# Patient Record
Sex: Female | Born: 1955 | Race: White | Hispanic: No | Marital: Single | Smoking: Former smoker
Health system: Southern US, Community
[De-identification: ages and names within clinical notes are randomized; demographics above are authoritative.]

## PROBLEM LIST (undated history)

## (undated) DIAGNOSIS — G4730 Sleep apnea, unspecified: Secondary | ICD-10-CM

## (undated) DIAGNOSIS — E785 Hyperlipidemia, unspecified: Secondary | ICD-10-CM

## (undated) DIAGNOSIS — M545 Low back pain, unspecified: Secondary | ICD-10-CM

## (undated) DIAGNOSIS — I10 Essential (primary) hypertension: Secondary | ICD-10-CM

## (undated) DIAGNOSIS — D649 Anemia, unspecified: Secondary | ICD-10-CM

## (undated) DIAGNOSIS — F419 Anxiety disorder, unspecified: Secondary | ICD-10-CM

## (undated) DIAGNOSIS — E119 Type 2 diabetes mellitus without complications: Secondary | ICD-10-CM

## (undated) DIAGNOSIS — Z923 Personal history of irradiation: Secondary | ICD-10-CM

## (undated) DIAGNOSIS — C50412 Malignant neoplasm of upper-outer quadrant of left female breast: Secondary | ICD-10-CM

## (undated) DIAGNOSIS — K219 Gastro-esophageal reflux disease without esophagitis: Secondary | ICD-10-CM

## (undated) DIAGNOSIS — C801 Malignant (primary) neoplasm, unspecified: Secondary | ICD-10-CM

## (undated) HISTORY — DX: Anxiety disorder, unspecified: F41.9

## (undated) HISTORY — DX: Gastro-esophageal reflux disease without esophagitis: K21.9

## (undated) HISTORY — DX: Type 2 diabetes mellitus without complications: E11.9

## (undated) HISTORY — PX: TONSILLECTOMY: SUR1361

## (undated) HISTORY — PX: TUBAL LIGATION: SHX77

## (undated) HISTORY — PX: DILATION AND CURETTAGE OF UTERUS: SHX78

## (undated) HISTORY — DX: Essential (primary) hypertension: I10

## (undated) HISTORY — DX: Hyperlipidemia, unspecified: E78.5

## (undated) HISTORY — DX: Anemia, unspecified: D64.9

## (undated) HISTORY — PX: COLONOSCOPY: SHX174

## (undated) HISTORY — DX: Malignant neoplasm of upper-outer quadrant of left female breast: C50.412

## (undated) HISTORY — PX: ESOPHAGOGASTRODUODENOSCOPY: SHX1529

---

## 2008-11-13 LAB — CONVERTED CEMR LAB

## 2009-02-27 ENCOUNTER — Encounter: Payer: Self-pay | Admitting: Family Medicine

## 2009-05-16 ENCOUNTER — Ambulatory Visit: Payer: Self-pay | Admitting: Family Medicine

## 2009-05-16 ENCOUNTER — Encounter: Payer: Self-pay | Admitting: Family Medicine

## 2009-05-16 DIAGNOSIS — K219 Gastro-esophageal reflux disease without esophagitis: Secondary | ICD-10-CM

## 2009-05-16 DIAGNOSIS — I10 Essential (primary) hypertension: Secondary | ICD-10-CM

## 2009-05-16 DIAGNOSIS — E785 Hyperlipidemia, unspecified: Secondary | ICD-10-CM

## 2009-06-11 ENCOUNTER — Telehealth: Payer: Self-pay | Admitting: Family Medicine

## 2009-07-16 ENCOUNTER — Telehealth: Payer: Self-pay | Admitting: Family Medicine

## 2009-10-23 ENCOUNTER — Ambulatory Visit: Payer: Self-pay | Admitting: Family Medicine

## 2009-10-23 DIAGNOSIS — F4322 Adjustment disorder with anxiety: Secondary | ICD-10-CM | POA: Insufficient documentation

## 2009-11-01 ENCOUNTER — Telehealth: Payer: Self-pay | Admitting: Family Medicine

## 2009-11-23 ENCOUNTER — Telehealth: Payer: Self-pay | Admitting: Family Medicine

## 2009-11-26 ENCOUNTER — Ambulatory Visit: Payer: Self-pay | Admitting: Family Medicine

## 2009-12-19 ENCOUNTER — Telehealth: Payer: Self-pay | Admitting: Family Medicine

## 2009-12-31 ENCOUNTER — Telehealth: Payer: Self-pay | Admitting: Family Medicine

## 2010-02-18 ENCOUNTER — Telehealth: Payer: Self-pay | Admitting: Family Medicine

## 2010-04-16 ENCOUNTER — Ambulatory Visit: Payer: Self-pay | Admitting: Family Medicine

## 2010-04-16 DIAGNOSIS — F17200 Nicotine dependence, unspecified, uncomplicated: Secondary | ICD-10-CM

## 2010-04-17 ENCOUNTER — Encounter: Payer: Self-pay | Admitting: Family Medicine

## 2010-04-17 LAB — CONVERTED CEMR LAB
Bilirubin, Direct: 0.1 mg/dL (ref 0.0–0.3)
CO2: 30 meq/L (ref 19–32)
Calcium: 9.3 mg/dL (ref 8.4–10.5)
Creatinine, Ser: 0.7 mg/dL (ref 0.4–1.2)
HDL: 47.6 mg/dL (ref >39.00)
Total CHOL/HDL Ratio: 4
Total Protein: 7.4 g/dL (ref 6.0–8.3)
Triglycerides: 164 mg/dL — ABNORMAL HIGH (ref 0.0–149.0)

## 2010-06-17 ENCOUNTER — Telehealth: Payer: Self-pay | Admitting: Family Medicine

## 2010-07-17 ENCOUNTER — Telehealth: Payer: Self-pay | Admitting: Family Medicine

## 2010-08-20 NOTE — Progress Notes (Signed)
Summary: Rx Alprazolam  Phone Note Refill Request Call back at [phone] Message from:  [facility] on February 18, 2010 2:18 PM  Refills Requested: Medication #1:  ALPRAZOLAM 0.25 MG TABS 1 tab three times a day as needed anxiety   Last Refilled: 12/19/2009 Received faxed refill request please advise.   Method Requested: Telephone to Pharmacy Initial call taken by: Linde Gillis CMA Duncan Dull),  February 18, 2010 2:19 PM  Follow-up for Phone Call        Rx called to pharmacy Follow-up by: Linde Gillis CMA Duncan Dull),  February 18, 2010 2:52 PM    Prescriptions: ALPRAZOLAM 0.25 MG TABS (ALPRAZOLAM) 1 tab three times a day as needed anxiety  #30 x 0   Entered and Authorized by:   Ruthe Mannan MD   Signed by:   Ruthe Mannan MD on 02/18/2010   Method used:   Telephoned to ...       [facility] [phone]* (retail)       [address] [place], [place]  [address]       Ph: [phone] or [phone]       Fax: [phone]   RxID:   [id number]

## 2010-08-20 NOTE — Progress Notes (Signed)
Summary: refill request for alprazolam  Phone Note Refill Request Message from:  Fax from Pharmacy  Refills Requested: Medication #1:  ALPRAZOLAM 0.25 MG TABS 1 tab three times a day as needed anxiety   Last Refilled: 10/23/2009 [facility] [address] phone [phone]  Initial call taken by: Lowella Petties CMA,  December 19, 2009 9:29 AM  Follow-up for Phone Call        Rx called to pharmacy Follow-up by: Linde Gillis CMA Duncan Dull),  December 19, 2009 9:37 AM    Prescriptions: ALPRAZOLAM 0.25 MG TABS (ALPRAZOLAM) 1 tab three times a day as needed anxiety  #30 x 0   Entered and Authorized by:   Ruthe Mannan MD   Signed by:   Ruthe Mannan MD on 12/19/2009   Method used:   Telephoned to ...       [facility] [phone]* (retail)       [address] [place], [place]  [address]       Ph: [phone] or [phone]       Fax: [phone]   RxID:   [id number]

## 2010-08-20 NOTE — Letter (Signed)
Summary: Generic Letter  [facility]  [address] [place], [place] [address]   Phone: [phone]  Fax: [phone]    04/17/2010  [facility] [address] [place], [place]  [address]  Dear Ms. Zern,     We have received your lab results and Dr. Dayton Martes says that your labs look good.  Triglycerides just a tiny bit elevated, otherwise cholesterol looks good.  Weight loss (even a small amount) can decrease triglycerides.  Decrease added sugars, eliminate trans fats, increase fiber and limit alcohol.  All these changes together can drop triglycerides by almost 50%.  Keep up the good work, we will recheck next year.      Sincerely,       Linde Gillis CMA (AAMA)for Dr. Ruthe Mannan
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
## 2010-08-20 NOTE — Progress Notes (Signed)
Summary: Rx Chantix  Phone Note Refill Request Call back at [phone] Message from:  [facility] on December 31, 2009 12:13 PM  Refills Requested: Medication #1:  CHANTIX 1 MG TABS Take 1 tablet by mouth two times a day. Received E-script request please advise.   Method Requested: Electronic Initial call taken by: Linde Gillis CMA Duncan Dull),  December 31, 2009 12:13 PM    Prescriptions: CHANTIX 1 MG TABS (VARENICLINE TARTRATE) Take 1 tablet by mouth two times a day  #60 x 0   Entered and Authorized by:   Ruthe Mannan MD   Signed by:   Ruthe Mannan MD on 12/31/2009   Method used:   Electronically to        [facility] [phone]* (retail)       [address] [place], [place]  [address]       Ph: [phone] or [phone]       Fax: [phone]   RxID:   [id number]
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
## 2010-08-20 NOTE — Progress Notes (Signed)
Summary: Chantix  Phone Note Refill Request Message from:  [facility] on Nov 23, 2009 10:14 AM  Refills Requested: Medication #1:  CHANTIX 1 MG TABS Take 1 tablet by mouth two times a day. [facility] [phone]*   Last Fill Date:  No date sent   Pharmacy Phone:  [phone]   Method Requested: Electronic Initial call taken by: Delilah Shan CMA (AAMA),  Nov 23, 2009 10:14 AM    New/Updated Medications: CHANTIX 1 MG TABS (VARENICLINE TARTRATE) Take 1 tablet by mouth two times a day Prescriptions: CHANTIX 1 MG TABS (VARENICLINE TARTRATE) Take 1 tablet by mouth two times a day  #60 x 0   Entered and Authorized by:   Ruthe Mannan MD   Signed by:   Ruthe Mannan MD on 11/23/2009   Method used:   Electronically to        [facility] [phone]* (retail)       [address] [place], [place]  [address]       Ph: [phone] or [phone]       Fax: [phone]   RxID:   [id number] CHANTIX STARTING MONTH PAK 0.5 MG X 11 & 1 MG X 42  MISC (VARENICLINE TARTRATE) 0.5mg  by mouth once daily for 3 days, then twice daily for 4 days and then 1mg  by mouth 2 times daily  #1 pack x 0   Entered and Authorized by:   Ruthe Mannan MD   Signed by:   Ruthe Mannan MD on 11/23/2009   Method used:   Electronically to        [facility] [phone]* (retail)       [address] [place], [place]  [address]       Ph: [phone] or [phone]       Fax: [phone]   RxID:   [id number]

## 2010-08-20 NOTE — Progress Notes (Signed)
Summary: Chantix 1mg   Phone Note Refill Request Call back at [phone] Message [facility]:  [facility]  on June 17, 2010 3:29 PM  Refills Requested: Medication #1:  CHANTIX 1 MG TABS Take 1 tablet by mouth two times a day   Last Refilled: 05/16/2010 Received faxed refill request please advise.   Method Requested: Electronic Initial call taken by: Linde Gillis CMA Duncan Dull),  June 17, 2010 3:30 PM    Prescriptions: CHANTIX 1 MG TABS (VARENICLINE TARTRATE) Take 1 tablet by mouth two times a day  #60 x 0   Entered and Authorized by:   Ruthe Mannan MD   Signed by:   Ruthe Mannan MD on 06/17/2010   Method used:   Electronically to        [facility] [phone]* (retail)       [address] [place], [place]  [address]       Ph: [phone] or [phone]       Fax: [phone]   RxID:   [id number]

## 2010-08-20 NOTE — Progress Notes (Signed)
Summary: feeling wierd   Phone Note Call from Patient Call back at [phone]- leave message   Caller: Patient Call For: Ruthe Mannan MD Summary of Call: Patient says that since starting the new meds on 10-23-09 she has been feeling shakey, restless, feeling quiet not wanting to talk to anyone of have any conversation which she says is not normal fo her. She also says that since then she has been wanting to smoke. She wants to know if she should continue taking meds and wait it out to see what happens of does she needs somehting different. Please advise. [facility].  Initial call taken by: Melody Comas,  November 01, 2009 11:20 AM  Follow-up for Phone Call        It can take several weeks for side effects to resolve.  If she feels they are intolerable, then she can stop the medication by taking one every other day for a a few days, then stop.   Follow-up by: Ruthe Mannan MD,  November 01, 2009 11:26 AM  Additional Follow-up for Phone Call Additional follow up Details #1::        [facility] for patient to call back. Melody Comas  November 01, 2009 11:43 AM  Advised pt. Additional Follow-up by: Lowella Petties CMA,  November 01, 2009 3:26 PM

## 2010-08-22 NOTE — Progress Notes (Signed)
Summary: refill reqeust for chantix  Phone Note Refill Request Message from:  Fax from Pharmacy  Refills Requested: Medication #1:  CHANTIX 1 MG TABS Take 1 tablet by mouth two times a day   Last Refilled: 06/17/2010 Faxed request from [facility] [address], [phone].  Initial call taken by: Lowella Petties CMA, AAMA,  July 17, 2010 2:34 PM    New/Updated Medications: CHANTIX 1 MG TABS (VARENICLINE TARTRATE) 1 tab by mouth 2 times daily Prescriptions: CHANTIX 1 MG TABS (VARENICLINE TARTRATE) 1 tab by mouth 2 times daily  #60 x 3   Entered and Authorized by:   Ruthe Mannan MD   Signed by:   Ruthe Mannan MD on 07/18/2010   Method used:   [facility]ronically to        [facility] [phone]* (retail)       [address] [place], [place]  [address]       Ph: [phone] or [phone]       Fax: [phone]   RxID:   [id number]

## 2010-09-12 ENCOUNTER — Ambulatory Visit: Payer: Self-pay | Admitting: Family Medicine

## 2010-09-16 ENCOUNTER — Ambulatory Visit (INDEPENDENT_AMBULATORY_CARE_PROVIDER_SITE_OTHER): Payer: BC Managed Care – PPO | Admitting: Family Medicine

## 2010-09-16 ENCOUNTER — Encounter: Payer: Self-pay | Admitting: Family Medicine

## 2010-09-16 DIAGNOSIS — K219 Gastro-esophageal reflux disease without esophagitis: Secondary | ICD-10-CM

## 2010-09-16 DIAGNOSIS — F17200 Nicotine dependence, unspecified, uncomplicated: Secondary | ICD-10-CM

## 2010-09-16 DIAGNOSIS — R21 Rash and other nonspecific skin eruption: Secondary | ICD-10-CM

## 2010-09-16 DIAGNOSIS — I10 Essential (primary) hypertension: Secondary | ICD-10-CM

## 2010-09-16 DIAGNOSIS — F4322 Adjustment disorder with anxiety: Secondary | ICD-10-CM

## 2010-09-26 NOTE — Assessment & Plan Note (Signed)
Summary: ACID REFLUX/CLE   [employer]   Vital Signs:  Patient profile:   55 year old female Height:      67 inches Weight:      196 pounds BMI:     30.81 Temp:     98.7 degrees F oral Pulse rate:   84 / minute Pulse rhythm:   regular BP sitting:   140 / 90  (left arm) Cuff size:   regular  Vitals Entered By: Linde Gillis CMA Duncan Dull) (September 16, 2010 10:54 AM) CC: acid reflux   History of Present Illness: 55 yo here to discuss multiple concerns:  Acid reflux- has been on Pantoprazole 40 mg daily for years. Recently, reflux symptoms have worsened.  Started smoking again. Chantix worked while she was taking it but started smoking immediately after she stopped taking it. Restarted Chantix recently.  Takes her Pantoprazole in the morning daily. Does not drink coffee.  Does not eat late at night.  Weaning off paxil- feels like she has "lost her edge" with paxil.  Nothing seems to bother her and she is concerned about that.  Wants to wean off of it and not start something else yet.    Rash under her eyes bilaterally- right>left.  Noticed it two weeks ago.  Not painful, just "irritated," itches a little.  No blurred vision or eye irritation.  Changed facial lotions a few weeks ago.    Current Medications (verified): 1)  Exforge 10-160 Mg Tabs (Amlodipine Besylate-Valsartan) .... Take 1 Tablet By Mouth Once A Day 2)  Simvastatin 40 Mg Tabs (Simvastatin) .... Take 1 Tablet By Mouth Once A Day 3)  Pantoprazole Sodium 40 Mg Tbec (Pantoprazole Sodium) .... Take 1 Tablet By Mouth Once A Day 4)  Multivitamins   Tabs (Multiple Vitamin) .... Take 1 Tablet By Mouth Once A Day 5)  Bone Restore .... Take 1 Tablet By Mouth Once A Day 6)  Black Kohash .... Once Daily 7)  Paroxetine Hcl 20 Mg  Tabs (Paroxetine Hcl) .... One By Mouth Daily 8)  Alprazolam 0.25 Mg Tabs (Alprazolam) .Marland Kitchen.. 1 Tab Three Times A Day As Needed Anxiety 9)  Chantix 1 Mg Tabs (Varenicline Tartrate) .... Take 1 Tablet By Mouth Two  Times A Day 10)  Chantix 1 Mg Tabs (Varenicline Tartrate) .Marland Kitchen.. 1 Tab By Mouth 2 Times Daily  Allergies (verified): No Known Drug Allergies  Past History:  Past Medical History: Last updated: 05/16/2009 GERD Hyperlipidemia Hypertension G1P1  Past Surgical History: Last updated: 05/16/2009 Tubal ligation  Family History: Last updated: 05/16/2009 Dad- diet 73 of stomach CA. Mom died at 78 of CAD, HTN  Social History: Last updated: 05/16/2009 Moved from [place] to [place] 1 month ago for a boyfriend.  Relationship is not working out, but she likes it here.  She is looking for new housing excited.  Former smoker.  Has one son who is moving here from [place].  Review of Systems      See HPI General:  Denies fever. Eyes:  Denies blurring, discharge, and light sensitivity. ENT:  Denies nasal congestion and postnasal drainage. CV:  Denies chest pain or discomfort. Resp:  Denies shortness of breath. GI:  Complains of indigestion; denies abdominal pain, nausea, and vomiting. GU:  Denies discharge and dysuria. MS:  Denies joint pain, joint redness, and joint swelling. Derm:  Complains of rash. Neuro:  Denies headaches. Psych:  Denies anxiety, depression, sense of great danger, suicidal thoughts/plans, thoughts of violence, unusual visions or sounds, and thoughts /  plans of harming others. Endo:  Denies cold intolerance and heat intolerance. Heme:  Denies abnormal bruising and bleeding.  Physical Exam  General:  Well-developed,well-nourished,in no acute distress; alert,appropriate and cooperative throughout examination Head:  normocephalic, atraumatic, no abnormalities observed, and no abnormalities palpated.   Eyes:  erythemtous raised plaques below eyes bilaterally, conjunctiva clear, PERRL Ears:  External ear exam shows no significant lesions or deformities.  Otoscopic examination reveals clear canals, tympanic membranes are intact bilaterally without bulging, retraction, inflammation  or discharge. Hearing is grossly normal bilaterally. Mouth:  Oral mucosa and oropharynx without lesions or exudates.  Teeth in good repair. Lungs:  Normal respiratory effort, chest expands symmetrically. Lungs are clear to auscultation, no crackles or wheezes. Heart:  Normal rate and regular rhythm. S1 and S2 normal without gallop, murmur, click, rub or other extra sounds. Extremities:  No clubbing, cyanosis, edema, or deformity noted with normal full range of motion of all joints.   Psych:  normally interactive, good eye contact, less anxious today.   Impression & Recommendations:  Problem # 1:  HYPERTENSION (ICD-401.9) Assessment Deteriorated BP a little elevated today. Smoked a cigarette and drank caffeinated beverage shortly before coming here. Previous BPs in office ok. Will monitor. Her updated medication list for this problem includes:    Exforge 10-160 Mg Tabs (Amlodipine besylate-valsartan) .Marland Kitchen... Take 1 tablet by mouth once a day  Problem # 2:  ADJUSTMENT DISORDER WITH ANXIETY (ICD-309.24) Assessment: Improved Has moved out of boyfriend's house, stressors improved. Will wean off of paxil, see pt instructions.  Problem # 3:  SKIN RASH (ICD-782.1) Assessment: New ? contact dermatitis vs eczema. advised discontinuing current lotion and restart one she used before. she will call in two weeks, if no improve, will try very low potency steroid cream.  Problem # 4:  GERD (ICD-530.81) Assessment: Deteriorated discussed possible irritants- smoking. She has restarted Chantix. Will try taking Pantoprazole at night since most of her symptoms are in the middle of the night. Her updated medication list for this problem includes:    Pantoprazole Sodium 40 Mg Tbec (Pantoprazole sodium) .Marland Kitchen... Take 1 tablet by mouth once a day  Complete Medication List: 1)  Exforge 10-160 Mg Tabs (Amlodipine besylate-valsartan) .... Take 1 tablet by mouth once a day 2)  Simvastatin 40 Mg Tabs  (Simvastatin) .... Take 1 tablet by mouth once a day 3)  Pantoprazole Sodium 40 Mg Tbec (Pantoprazole sodium) .... Take 1 tablet by mouth once a day 4)  Multivitamins Tabs (Multiple vitamin) .... Take 1 tablet by mouth once a day 5)  Bone Restore  .... Take 1 tablet by mouth once a day 6)  Black Kohash  .... Once daily 7)  Paroxetine Hcl 20 Mg Tabs (Paroxetine hcl) .... One by mouth daily 8)  Alprazolam 0.25 Mg Tabs (Alprazolam) .Marland Kitchen.. 1 tab three times a day as needed anxiety 9)  Chantix 1 Mg Tabs (Varenicline tartrate) .... Take 1 tablet by mouth two times a day 10)  Chantix 1 Mg Tabs (Varenicline tartrate) .Marland Kitchen.. 1 tab by mouth 2 times daily  Patient Instructions: 1)  In order to wean off of paxil: 2)  1 tablet every other day for 4 days, then 1/2 tablet every other day for four days and stop. 3)  call me in 2 weeks with an update on your eye. 4)  If Nexium is better, we can either give you more samples or a prescription.  hopefully we will get dexilant samples by then.  Orders Added: 1)  Est. Patient Level IV [16109]    Current Allergies (reviewed today): No known allergies

## 2010-09-30 ENCOUNTER — Telehealth: Payer: Self-pay | Admitting: Family Medicine

## 2010-10-08 NOTE — Progress Notes (Signed)
Summary: Update on patient  Phone Note Call from Patient Call back at [phone]   Caller: Patient Call For: Ruthe Mannan MD Summary of Call: Patient states that she still has the rash under both eyes.  The Pantoprazole that she is taking at night is not working.  We do have a 30 day supply of Dexilant and I will leave them at the front desk for patient to pick up.   Initial call taken by: Linde Gillis CMA Duncan Dull),  September 30, 2010 12:50 PM  Follow-up for Phone Call        ok let's refer her to derm for rash under her eyes.  I will place order. Ruthe Mannan MD  September 30, 2010 12:59 PM  Left message on cell phone voicemail advising patient as instructed.   Follow-up by: Linde Gillis CMA Duncan Dull),  September 30, 2010 1:52 PM

## 2010-11-18 ENCOUNTER — Other Ambulatory Visit: Payer: Self-pay | Admitting: *Deleted

## 2010-11-18 MED ORDER — VARENICLINE TARTRATE 1 MG PO TABS: 1.0000 mg | ORAL_TABLET | Freq: Two times a day (BID) | ORAL | Status: DC

## 2010-12-17 ENCOUNTER — Telehealth: Payer: Self-pay | Admitting: *Deleted

## 2010-12-17 NOTE — Telephone Encounter (Signed)
Pt called earlier today, requested samples of dexilant but I advised her there are no samples of that so asked if she could have samples of nexium.  OK per Dr. Dayton Martes.  3 sample boxes of # 5 each given.  Lot number [id number], exp 04/2013.

## 2010-12-27 ENCOUNTER — Other Ambulatory Visit: Payer: Self-pay | Admitting: *Deleted

## 2010-12-27 MED ORDER — VARENICLINE TARTRATE 1 MG PO TABS: 1.0000 mg | ORAL_TABLET | Freq: Two times a day (BID) | ORAL | Status: AC

## 2011-02-17 ENCOUNTER — Telehealth: Payer: Self-pay | Admitting: *Deleted

## 2011-02-17 NOTE — Telephone Encounter (Signed)
Pt requests samples of dexilant.  OK to give 2 boxes of #5 each?  If ok I will give the 2 boxes, lot number [id number], exp 04/2012.

## 2011-02-17 NOTE — Telephone Encounter (Signed)
Samples placed up front for pick up.  

## 2011-02-17 NOTE — Telephone Encounter (Signed)
Yes ok to give samples.

## 2011-03-10 ENCOUNTER — Telehealth: Payer: Self-pay | Admitting: *Deleted

## 2011-03-10 NOTE — Telephone Encounter (Signed)
Pt requests samples of dexilant before she goes on vacation.  3 sample boxes of #5 each given. Lot number [id number], exp 08/2012.  Pt to pick up tomorrow.

## 2011-03-21 ENCOUNTER — Other Ambulatory Visit: Payer: Self-pay | Admitting: Family Medicine

## 2011-03-21 DIAGNOSIS — E785 Hyperlipidemia, unspecified: Secondary | ICD-10-CM

## 2011-03-21 DIAGNOSIS — F17200 Nicotine dependence, unspecified, uncomplicated: Secondary | ICD-10-CM

## 2011-03-21 DIAGNOSIS — I10 Essential (primary) hypertension: Secondary | ICD-10-CM

## 2011-03-26 ENCOUNTER — Telehealth: Payer: Self-pay | Admitting: *Deleted

## 2011-03-26 NOTE — Telephone Encounter (Signed)
Pt called requesting samples of dexilant- 2 boxes of #5 each given, lot number [id number], exp 08/2012.

## 2011-03-27 ENCOUNTER — Other Ambulatory Visit: Payer: BC Managed Care – PPO

## 2011-04-01 NOTE — Telephone Encounter (Signed)
Dr. Aron, did you see this note? 

## 2011-04-01 NOTE — Telephone Encounter (Signed)
Noted  

## 2011-04-02 ENCOUNTER — Encounter: Payer: BC Managed Care – PPO | Admitting: Family Medicine

## 2011-04-07 ENCOUNTER — Other Ambulatory Visit: Payer: Self-pay | Admitting: *Deleted

## 2011-04-07 MED ORDER — PAROXETINE HCL 20 MG PO TABS: 20.0000 mg | ORAL_TABLET | ORAL | Status: DC

## 2011-04-07 NOTE — Telephone Encounter (Signed)
[facility] 

## 2011-04-07 NOTE — Telephone Encounter (Signed)
Please call pt to see if she restarted Paxil and if she does in fact want this refill. Thanks

## 2011-04-07 NOTE — Telephone Encounter (Signed)
Spoke with patient and she did request this refill.  She stated that she didn't wean off of Paxil, she has a f/u appt with you next week.  Patient also request samples of Dexilant or Nexium.  One month supply left at front desk of Dexilant.

## 2011-04-07 NOTE — Telephone Encounter (Signed)
According to last OV note on 09/16/2010 patient was suppose to be weaning off Paxil, please advise.

## 2011-04-09 NOTE — Telephone Encounter (Signed)
Dr. Aron, did you see this note? 

## 2011-04-09 NOTE — Telephone Encounter (Signed)
Noted! Thank you

## 2011-04-10 ENCOUNTER — Ambulatory Visit (INDEPENDENT_AMBULATORY_CARE_PROVIDER_SITE_OTHER): Payer: BC Managed Care – PPO | Admitting: Family Medicine

## 2011-04-10 DIAGNOSIS — E785 Hyperlipidemia, unspecified: Secondary | ICD-10-CM

## 2011-04-10 DIAGNOSIS — I10 Essential (primary) hypertension: Secondary | ICD-10-CM

## 2011-04-10 DIAGNOSIS — F17200 Nicotine dependence, unspecified, uncomplicated: Secondary | ICD-10-CM

## 2011-04-10 LAB — COMPREHENSIVE METABOLIC PANEL
ALT: 87 U/L — ABNORMAL HIGH (ref 0–35)
Albumin: 4 g/dL (ref 3.5–5.2)
BUN: 10 mg/dL (ref 6–23)
CO2: 27 mEq/L (ref 19–32)
Calcium: 8.7 mg/dL (ref 8.4–10.5)
Chloride: 105 mEq/L (ref 96–112)
Creatinine, Ser: 0.7 mg/dL (ref 0.4–1.2)
GFR: 98.8 mL/min (ref >60.00)

## 2011-04-10 LAB — LIPID PANEL
Cholesterol: 148 mg/dL (ref 0–200)
Total CHOL/HDL Ratio: 3
Triglycerides: 92 mg/dL (ref 0.0–149.0)

## 2011-04-10 LAB — CBC WITH DIFFERENTIAL/PLATELET
Basophils Absolute: 0 10*3/uL (ref 0.0–0.1)
Basophils Relative: 0.6 % (ref 0.0–3.0)
Eosinophils Absolute: 0.2 10*3/uL (ref 0.0–0.7)
Lymphocytes Relative: 33.4 % (ref 12.0–46.0)
MCHC: 32.8 g/dL (ref 30.0–36.0)
Neutrophils Relative %: 57.8 % (ref 43.0–77.0)
Platelets: 300 10*3/uL (ref 150.0–400.0)
RBC: 4.2 Mil/uL (ref 3.87–5.11)
RDW: 15 % — ABNORMAL HIGH (ref 11.5–14.6)

## 2011-04-17 ENCOUNTER — Encounter: Payer: Self-pay | Admitting: Family Medicine

## 2011-04-17 ENCOUNTER — Ambulatory Visit (INDEPENDENT_AMBULATORY_CARE_PROVIDER_SITE_OTHER): Payer: BC Managed Care – PPO | Admitting: Family Medicine

## 2011-04-17 ENCOUNTER — Encounter: Payer: Self-pay | Admitting: Internal Medicine

## 2011-04-17 VITALS — BP 140/72 | HR 88 | Temp 98.6°F | Ht 67.0 in | Wt 202.8 lb

## 2011-04-17 DIAGNOSIS — Z0000 Encounter for general adult medical examination without abnormal findings: Secondary | ICD-10-CM | POA: Insufficient documentation

## 2011-04-17 DIAGNOSIS — F4322 Adjustment disorder with anxiety: Secondary | ICD-10-CM

## 2011-04-17 DIAGNOSIS — K219 Gastro-esophageal reflux disease without esophagitis: Secondary | ICD-10-CM

## 2011-04-17 DIAGNOSIS — F17200 Nicotine dependence, unspecified, uncomplicated: Secondary | ICD-10-CM

## 2011-04-17 DIAGNOSIS — Z23 Encounter for immunization: Secondary | ICD-10-CM

## 2011-04-17 DIAGNOSIS — I10 Essential (primary) hypertension: Secondary | ICD-10-CM

## 2011-04-17 DIAGNOSIS — E785 Hyperlipidemia, unspecified: Secondary | ICD-10-CM

## 2011-04-17 DIAGNOSIS — R7309 Other abnormal glucose: Secondary | ICD-10-CM

## 2011-04-17 DIAGNOSIS — R739 Hyperglycemia, unspecified: Secondary | ICD-10-CM

## 2011-04-17 DIAGNOSIS — Z1211 Encounter for screening for malignant neoplasm of colon: Secondary | ICD-10-CM

## 2011-04-17 DIAGNOSIS — R7989 Other specified abnormal findings of blood chemistry: Secondary | ICD-10-CM

## 2011-04-17 LAB — HM MAMMOGRAPHY

## 2011-04-17 MED ORDER — PAROXETINE HCL 20 MG PO TABS: 20.0000 mg | ORAL_TABLET | ORAL | Status: DC

## 2011-04-17 MED ORDER — SIMVASTATIN 40 MG PO TABS: 40.0000 mg | ORAL_TABLET | Freq: Every day | ORAL | Status: DC

## 2011-04-17 MED ORDER — AMLODIPINE BESYLATE-VALSARTAN 10-160 MG PO TABS: 1.0000 | ORAL_TABLET | Freq: Every day | ORAL | Status: DC

## 2011-04-17 MED ORDER — SIMVASTATIN 40 MG PO TABS: 20.0000 mg | ORAL_TABLET | Freq: Every day | ORAL | Status: DC

## 2011-04-17 NOTE — Progress Notes (Signed)
Subjective:    Patient ID: Connie Maldonado, female    DOB: [birth year], 55 y.o.   MRN: [id number]  HPI  55 yo here for CPX.  HTN- has been on Exforge 10/160 daily for years. No problems with CP, SOB, or headahces.  No blurred vision.  BP elevated today but having back pain- seeing Ortho.   BP Readings from Last 3 Encounters:  04/17/11 140/72  09/16/10 140/90  04/16/10 122/80   Elevated LFTs- Lab Results  Component Value Date   ALT 87* 04/10/2011   AST 108* 04/10/2011   ALKPHOS 101 04/10/2011   BILITOT 0.4 04/10/2011   On zocor 40 mg daily, also been taking Vicodin for pain.     HLD- has been on simvastatin for years.  Lab Results  Component Value Date   CHOL 148 04/10/2011   CHOL 178 04/16/2010   Lab Results  Component Value Date   HDL 45.40 04/10/2011   HDL 40.98 04/16/2010   Lab Results  Component Value Date   LDLCALC 84 04/10/2011   LDLCALC 98 04/16/2010   Lab Results  Component Value Date   TRIG 92.0 04/10/2011   TRIG 164.0* 04/16/2010     GERD- taking protonix daily and has not had any issues with symptoms of reflux.  Hyperglycemia- Fasting CBG elevated, admits to having sugar in her tea before having labs drawn.    H/o tobacco abuse- quit 1 year ago using Chantix.  Still has cravings.   Well woman- G1P1, no h/o abnormal paps.  LMP 11/27/08.  Last pap and mammogram last week.    Sees GYN- Alicia Copland, [facility].   Review of Systems See HPI Patient reports no  vision/ hearing changes,anorexia, weight change, fever ,adenopathy, persistant / recurrent hoarseness, swallowing issues, chest pain, edema,persistant / recurrent cough, hemoptysis, dyspnea(rest, exertional, paroxysmal nocturnal), gastrointestinal  bleeding (melena, rectal bleeding), abdominal pain, excessive heart burn, GU symptoms(dysuria, hematuria, pyuria, voiding/incontinence  Issues) syncope, focal weakness, severe memory loss, concerning skin lesions, depression, anxiety, abnormal  bruising/bleeding, major joint swelling, breast masses or abnormal vaginal bleeding.       Objective:   Physical Exam BP 140/72  Pulse 88  Temp(Src) 98.6 F (37 C) (Oral)  Ht 5\' 7"  (1.702 m)  Wt 202 lb 12 oz (91.967 kg)  BMI 31.76 kg/m2  General:  Well-developed,well-nourished,in no acute distress; alert,appropriate and cooperative throughout examination Head:  normocephalic and atraumatic.   Eyes:  vision grossly intact, pupils equal, pupils round, and pupils reactive to light.   Ears:  R ear normal and L ear normal.   Nose:  no external deformity.   Mouth:  good dentition.   Neck:  No deformities, masses, or tenderness noted. Lungs:  Normal respiratory effort, chest expands symmetrically. Lungs are clear to auscultation, no crackles or wheezes. Heart:  Normal rate and regular rhythm. S1 and S2 normal without gallop, murmur, click, rub or other extra sounds. Abdomen:  Bowel sounds positive,abdomen soft and non-tender without masses, organomegaly or hernias noted. Msk:  No deformity or scoliosis noted of thoracic or lumbar spine.   Extremities:  No clubbing, cyanosis, edema, or deformity noted with normal full range of motion of all joints.   Neurologic:  alert & oriented X3 and gait normal.   Skin:  Intact without suspicious lesions or rashes Cervical Nodes:  No lymphadenopathy noted Axillary Nodes:  No palpable lymphadenopathy Psych:  Cognition and judgment appear intact. Alert and cooperative with normal attention span and concentration. No apparent delusions, illusions, hallucinations  Assessment & Plan:   1. Routine general medical examination at a health care facility   Reviewed preventive care protocols, scheduled due services, and updated immunizations Discussed nutrition, exercise, diet, and healthy lifestyle.    2. HYPERTENSION   Deteriorated, likely secondary to pain. Was normotensive at ortho office yesterday. amLODipine-valsartan (EXFORGE) 10-160 MG per  tablet  3. HYPERLIPIDEMIA   Improved.  Will decreased dose to 20 mg daily. simvastatin (ZOCOR) 40 MG tablet   4. Hyperglycemia   New.  Likely due to not fasting, will recheck labs in 8 weeks.   Hemoglobin A1c, Basic Metabolic Panel (BMET)  5. Elevated liver function tests   New.  Cut back on Simvastatin dosage, decrease Vicodin. Repeat labs in 8 weeks.   Hepatic function panel

## 2011-04-17 NOTE — Progress Notes (Signed)
Addended by: Gilmer Mor on: 04/17/2011 10:06 AM   Modules accepted: Orders

## 2011-04-17 NOTE — Patient Instructions (Addendum)
Good to see you. Please stop by to see Connie Maldonado on your way out to set up your colonoscopy. Please come back in 8-12 weeks to have your labs rechecked (no sugar in your tea please). Cut your simvastatin dose in half (20 mg daily instead of 40 mg ).

## 2011-04-23 ENCOUNTER — Other Ambulatory Visit: Payer: Self-pay | Admitting: *Deleted

## 2011-04-23 MED ORDER — ALPRAZOLAM 0.25 MG PO TABS: ORAL_TABLET | ORAL | Status: DC

## 2011-04-23 NOTE — Telephone Encounter (Signed)
Last refill 04/18/2010.

## 2011-04-23 NOTE — Telephone Encounter (Signed)
Rx called to pharmacy

## 2011-04-24 LAB — HM MAMMOGRAPHY: HM Mammogram: NORMAL

## 2011-04-25 LAB — HM DEXA SCAN: HM Dexa Scan: NORMAL

## 2011-05-01 ENCOUNTER — Encounter: Payer: Self-pay | Admitting: *Deleted

## 2011-05-01 ENCOUNTER — Encounter: Payer: Self-pay | Admitting: Family Medicine

## 2011-05-02 ENCOUNTER — Encounter: Payer: Self-pay | Admitting: *Deleted

## 2011-05-02 ENCOUNTER — Encounter: Payer: Self-pay | Admitting: Family Medicine

## 2011-05-07 ENCOUNTER — Telehealth: Payer: Self-pay | Admitting: *Deleted

## 2011-05-07 NOTE — Telephone Encounter (Signed)
Pt requests samples of nexium or dexilant.  No dexilant, samples of nexium given.  Five boxes of # 5 each given.  Lot number [id number], exp 12/2013.  Pt to pick up.

## 2011-05-08 NOTE — Telephone Encounter (Signed)
Noted  

## 2011-05-08 NOTE — Telephone Encounter (Signed)
Dr. Dayton Martes, did you see this note?

## 2011-05-09 ENCOUNTER — Telehealth: Payer: Self-pay | Admitting: *Deleted

## 2011-05-09 ENCOUNTER — Ambulatory Visit (AMBULATORY_SURGERY_CENTER): Payer: BC Managed Care – PPO | Admitting: *Deleted

## 2011-05-09 VITALS — Ht 67.0 in | Wt 197.2 lb

## 2011-05-09 DIAGNOSIS — Z1211 Encounter for screening for malignant neoplasm of colon: Secondary | ICD-10-CM

## 2011-05-09 NOTE — Progress Notes (Signed)
PHONE NOTE SENT TO DR.GESSNER ?TIME FOR COLONOSCOPY. PT HAS NO FAMILY HX, NO GI CONCERNS. LAST [place]OS[place] 2007 [place] ALSO GAVE DR.GESSNER THE FAXED RESULTS FROM [place]

## 2011-05-09 NOTE — Telephone Encounter (Signed)
PATIENT HERE FOR PREVISIT. LAST COL[place]OSCOPY 2007 [place]HYPERLASTIC POLYP X1. PATIENT DENIES ANY GI SYMPTOMS, DENIES FAMILY HX COLON CANCER. FATHER HAD STOMACH CANCER AT AGE 55. DO YOU WANT PATIENT TO HAVE RECALL COLONOSCOPY AT THIS TIME?

## 2011-05-12 ENCOUNTER — Encounter: Payer: Self-pay | Admitting: Internal Medicine

## 2011-05-13 ENCOUNTER — Encounter: Payer: Self-pay | Admitting: Internal Medicine

## 2011-05-13 NOTE — Telephone Encounter (Signed)
Pt's partner, Amy, notified that pt does not need colonoscopy until 2017.  Scheduled colonoscopy cancelled and recall for 2017 entered into computer. Ezra Sites

## 2011-05-13 NOTE — Telephone Encounter (Signed)
I have reviewed her records. She does not need a colonoscopy (routine) again until around October 2017. She had a polyp but it was small and not pre-cancerous.  Please call her and let her know and cancel this colonoscopy.  I have cced Dr. Dayton Martes

## 2011-05-20 ENCOUNTER — Encounter: Payer: Self-pay | Admitting: *Deleted

## 2011-05-20 ENCOUNTER — Encounter: Payer: Self-pay | Admitting: Family Medicine

## 2011-05-21 ENCOUNTER — Telehealth: Payer: Self-pay | Admitting: *Deleted

## 2011-05-21 MED ORDER — ZOLPIDEM TARTRATE 5 MG PO TABS: 5.0000 mg | ORAL_TABLET | Freq: Every evening | ORAL | Status: DC | PRN

## 2011-05-21 NOTE — Telephone Encounter (Signed)
Left voicemail for pt to return my call 

## 2011-05-21 NOTE — Telephone Encounter (Signed)
Pt request samples of Nexium or Dexilant (whichever is fine), she also request your call for a non emergent medical issue.

## 2011-05-21 NOTE — Telephone Encounter (Signed)
Called patient on cell phone several times and continue to get a recording that says all circuits are busy.  Called home number and got no answer or machine.  Rx and samples of Nexium left at front desk.

## 2011-05-21 NOTE — Telephone Encounter (Signed)
Addended by: Dianne Dun on: 05/21/2011 11:36 AM   Modules accepted: Orders

## 2011-05-21 NOTE — Telephone Encounter (Signed)
Spoke with pt.  Having a hard time falling asleep lately, increased stressors. Would like to try low dose ambien. I am in agreement for short period of time. Please leave rx up front for her to pick up with her nexium samples.

## 2011-05-21 NOTE — Telephone Encounter (Signed)
I'm ok with her receiving samples, although it looks like she just picked some up on 05/07/2011.

## 2011-05-26 ENCOUNTER — Other Ambulatory Visit: Payer: Self-pay | Admitting: *Deleted

## 2011-05-26 MED ORDER — VARENICLINE TARTRATE 1 MG PO TABS: 1.0000 mg | ORAL_TABLET | Freq: Two times a day (BID) | ORAL | Status: AC

## 2011-05-26 NOTE — Telephone Encounter (Signed)
Last refill 03/23/2011. 

## 2011-05-28 ENCOUNTER — Other Ambulatory Visit: Payer: BC Managed Care – PPO | Admitting: Internal Medicine

## 2011-07-01 ENCOUNTER — Telehealth: Payer: Self-pay | Admitting: *Deleted

## 2011-07-01 DIAGNOSIS — E785 Hyperlipidemia, unspecified: Secondary | ICD-10-CM

## 2011-07-01 NOTE — Telephone Encounter (Signed)
Orders entered

## 2011-07-01 NOTE — Telephone Encounter (Signed)
Pt calling wants to have lab appt  to check chol. Needs orders

## 2011-07-02 ENCOUNTER — Other Ambulatory Visit: Payer: BC Managed Care – PPO

## 2011-07-02 NOTE — Telephone Encounter (Signed)
Left message on cell phone voicemail advising patient that lab orders have been entered and she can call back to schedule lab appt.  She should be fast at the time of the lab appt.

## 2011-07-04 ENCOUNTER — Telehealth: Payer: Self-pay | Admitting: Internal Medicine

## 2011-07-04 NOTE — Telephone Encounter (Signed)
Patient called and wanted to speak with you she stated it would only take a few minutes of your time.  She would not let me know what it regarded.

## 2011-07-04 NOTE — Telephone Encounter (Signed)
Returned call to number provided-  [phone],  No answer or voicemail. Please try to call her again later.

## 2011-07-07 ENCOUNTER — Other Ambulatory Visit (INDEPENDENT_AMBULATORY_CARE_PROVIDER_SITE_OTHER): Payer: BC Managed Care – PPO

## 2011-07-07 DIAGNOSIS — R739 Hyperglycemia, unspecified: Secondary | ICD-10-CM

## 2011-07-07 DIAGNOSIS — R7309 Other abnormal glucose: Secondary | ICD-10-CM

## 2011-07-07 DIAGNOSIS — E785 Hyperlipidemia, unspecified: Secondary | ICD-10-CM

## 2011-07-07 LAB — HEPATIC FUNCTION PANEL
AST: 81 U/L — ABNORMAL HIGH (ref 0–37)
Albumin: 3.9 g/dL (ref 3.5–5.2)
Alkaline Phosphatase: 85 U/L (ref 39–117)
Total Bilirubin: 0.5 mg/dL (ref 0.3–1.2)

## 2011-07-07 LAB — LIPID PANEL: HDL: 43.4 mg/dL (ref >39.00)

## 2011-07-17 ENCOUNTER — Telehealth: Payer: Self-pay | Admitting: *Deleted

## 2011-07-17 NOTE — Telephone Encounter (Signed)
Spoke to patient and explained that the charges are based on documentation and coding and the amount she is billed is based on her contract with her insurance company.  Patient said we were over billing for the amount of time doctor and nurse spent in the office with her.  I reviewed the detail of the visit with her and there were several diagnosis that could have been discussed that day and that there is work and time that go into the visit prior to and after the patient's visit for documentation and refills, etc.  Patient still felt we were overcharging and she was being treated unfairly.  I asked if there was anything else I could do to answer her questions and she said no and proceeded to cuss and argue her point and said she would either make another appointment or find another doctor.  I apologized that she felt that way and told her that I understood she was upset and it is certainly her choice if she would like to go to another office.  I also offered to provide information about financial assistance through [facility] and explained that they could look at what options might be available for her but not sure of what would be available given she has insurance and they would need to advise.  She declined the information.  She thanked me for explaining and said she understood and that this was just part of the health care and insurance and continued with negative/derogative language.  Again, I apologized and asked if there was anything further I could do to assist her and she said no thank you.

## 2011-07-17 NOTE — Telephone Encounter (Signed)
Patient called because she wants to speak with Dr. Dayton Martes regarding her office visit charges.  She called the billing office and was informed that an office visit is $183.00.  Patient stated that she doesn't come to see Dr. Dayton Martes much but she can't afford to pay $183.00 for an office visit.  She pays less when she goes to see a specialist.  She may have to find another doctor.  She would just like to speak with Dr. Dayton Martes, she stated that it's not our fault and I explained to her that Dr. Dayton Martes does not set the charges for the visits.  Please advise.

## 2011-07-17 NOTE — Telephone Encounter (Signed)
I will forward this to Jamesetta So to address this issue. I really have no control over how much her insurance covers and does not cover.  I am very sorry that it was so costly for her and I understand why she would want to speak with our office manager.

## 2011-07-17 NOTE — Telephone Encounter (Signed)
I try my best to never over code.  If she would like my notes to audited by Molli Hazard or Darl Pikes, I am happy to go through that process.  We do the best we can to code as fairly and accurately as possible.  I will forward to [facility] as well.

## 2011-07-18 NOTE — Telephone Encounter (Signed)
Agree with provider's comments.  Will send for further review for good measure.

## 2011-07-24 ENCOUNTER — Ambulatory Visit: Payer: BC Managed Care – PPO | Admitting: Family Medicine

## 2011-07-29 ENCOUNTER — Ambulatory Visit (INDEPENDENT_AMBULATORY_CARE_PROVIDER_SITE_OTHER): Payer: BC Managed Care – PPO | Admitting: Family Medicine

## 2011-07-29 ENCOUNTER — Encounter: Payer: Self-pay | Admitting: Family Medicine

## 2011-07-29 DIAGNOSIS — E119 Type 2 diabetes mellitus without complications: Secondary | ICD-10-CM | POA: Insufficient documentation

## 2011-07-29 MED ORDER — METFORMIN HCL 500 MG PO TABS: 500.0000 mg | ORAL_TABLET | Freq: Two times a day (BID) | ORAL | Status: DC

## 2011-07-29 MED ORDER — ALPRAZOLAM 0.25 MG PO TABS: ORAL_TABLET | ORAL | Status: DC

## 2011-07-29 MED ORDER — ONETOUCH BASIC SYSTEM W/DEVICE KIT: PACK | Status: DC

## 2011-07-29 NOTE — Progress Notes (Signed)
  Subjective:    Patient ID: Connie Maldonado, female    DOB: [birth year], 56 y.o.   MRN: [id number]  56 yo here to discuss new diagnosis of DM.  Lab Results  Component Value Date   HGBA1C 7.2* 07/07/2011   Does not have FH of DM.  Denies any increased thirst or urination.   Recently joined bariatric clinic- exercising and trying to loose weight.  They just prescribed phentermine for her.  Wt Readings from Last 3 Encounters:  07/29/11 191 lb 12 oz (86.977 kg)  05/09/11 197 lb 3.2 oz (89.449 kg)  04/17/11 202 lb 12 oz (91.967 kg)      Review of Systems See HPI Patient reports no  vision/ hearing changes,anorexia, weight change, fever ,adenopathy, persistant / recurrent hoarseness, swallowing issues, chest pain, edema,persistant / recurrent cough, hemoptysis, dyspnea(rest, exertional, paroxysmal nocturnal), gastrointestinal  bleeding (melena, rectal bleeding), abdominal pain, excessive heart burn, GU symptoms(dysuria, hematuria, pyuria, voiding/incontinence  Issues) syncope, focal weakness, severe memory loss, concerning skin lesions, depression, anxiety, abnormal bruising/bleeding, major joint swelling, breast masses or abnormal vaginal bleeding.       Objective:   Physical Exam BP 130/80  Pulse 72  Temp(Src) 97.8 F (36.6 C) (Oral)  Wt 191 lb 12 oz (86.977 kg)  General:  Well-developed,well-nourished,in no acute distress; alert,appropriate and cooperative throughout examination Head:  normocephalic and atraumatic.   Eyes:  vision grssly intact, pupils equal, pupils round, and pupils reactive to light.   Skin:  Intact without suspicious lesions or rashes Psych:  Cognition and judgment appear intact. Alert and cooperative with normal attention span and concentration. No apparent delusions, illusions, hallucinations     Assessment & Plan:   1. Diabetes mellitus  Hemoglobin A1c   New. >25 min spent with face to face with patient, >50% counseling and/or coordinating  care. Has very high deductible, currently refusing diabetic teaching with nutritionist. Discussed eat right diet and blood sugar readings- eat right handout, ADA handout and glucose diary given. Will start Metformin 500 mg twice daily, follow up in 2 months. The patient indicates understanding of these issues and agrees with the plan.

## 2011-07-29 NOTE — Patient Instructions (Addendum)
Good to see you. Please come back to see me in 2 months to recheck your a1c (lab visit only if you would like).

## 2011-07-30 ENCOUNTER — Other Ambulatory Visit: Payer: Self-pay | Admitting: *Deleted

## 2011-07-30 MED ORDER — ALPRAZOLAM 0.25 MG PO TABS: ORAL_TABLET | ORAL | Status: DC

## 2011-07-30 NOTE — Telephone Encounter (Signed)
Rx was called in on 07/29/2011 for #30, and was suppose to be #90.  Called [facility] and left a message on pharmacy voicemail advising of the correction and a corrected fax was faxed to them at [phone].

## 2011-08-28 ENCOUNTER — Telehealth: Payer: Self-pay | Admitting: Family Medicine

## 2011-08-28 NOTE — Telephone Encounter (Signed)
Spoke to patient and she requested samples of Nexium.  30 day supply left at front desk, patient will pick them up tomorrow.

## 2011-08-28 NOTE — Telephone Encounter (Signed)
Date: 08/27/2011 12:00:00 AM Time of Call: [phone] Faxed To: [facility] (Daytime Triage) Caller: Affie Fax Number: [phone] Facility: N/A Patient: Connie, Maldonado DOB: Nov 12, 1955 Phone: [phone] Provider: Ruthe Mannan [facility]) Message: She is requesting for [place] to return her call about nexium. Regarding Appointment: Appt Date: Appt Time: Unknown Provider: Reason: Details: Outcome: Message Taken by: Thomasena Edis, CSR FAX Call-A

## 2011-08-28 NOTE — Telephone Encounter (Signed)
Left message on cell phone voicemail for patient to return call. 

## 2011-09-02 ENCOUNTER — Other Ambulatory Visit: Payer: Self-pay | Admitting: *Deleted

## 2011-09-02 DIAGNOSIS — I10 Essential (primary) hypertension: Secondary | ICD-10-CM

## 2011-09-02 MED ORDER — AMLODIPINE BESYLATE-VALSARTAN 10-160 MG PO TABS: 1.0000 | ORAL_TABLET | Freq: Every day | ORAL | Status: DC

## 2011-09-22 ENCOUNTER — Telehealth: Payer: Self-pay | Admitting: Family Medicine

## 2011-09-22 NOTE — Telephone Encounter (Signed)
To: [facility] (Daytime Triage) Fax: [phone] From: [facility] Date/ Time: 09/22/2011 3:59 PM Taken By: Jeraldine Loots, RN Caller: Bonita Quin Facility: not collected Patient: Connie Maldonado, Connie Maldonado DOB: May 21, 1956 Phone: [phone] Reason for Call: Pt. Calling to speak to [facility] about Nexium and a blood test. Wouldn't give any additional information. Regarding Appointment: Appt Date: Appt Time: Unknown Provider: Reason: Details: Outcome:

## 2011-09-23 NOTE — Telephone Encounter (Signed)
Patient called requesting samples of Nexium.  30 day supply left at front desk for patient to pick up.  She also scheduled to have A1C labs done on 09/30/2011.

## 2011-09-30 ENCOUNTER — Other Ambulatory Visit (INDEPENDENT_AMBULATORY_CARE_PROVIDER_SITE_OTHER): Payer: BC Managed Care – PPO

## 2011-09-30 DIAGNOSIS — E119 Type 2 diabetes mellitus without complications: Secondary | ICD-10-CM

## 2011-10-28 ENCOUNTER — Telehealth: Payer: Self-pay

## 2011-10-28 NOTE — Telephone Encounter (Signed)
Pt request referral faxed to [facility] for 2 hour refresher diabetic class and pt wants 30 days of Nexium 40 mg samples. Pt can be reached at [phone] or [phone].

## 2011-10-28 NOTE — Telephone Encounter (Signed)
Referral placed. Ok to give her nexium samples if we have them. Thank you.

## 2011-10-29 ENCOUNTER — Other Ambulatory Visit: Payer: Self-pay

## 2011-10-29 MED ORDER — ZOLPIDEM TARTRATE 5 MG PO TABS: 5.0000 mg | ORAL_TABLET | Freq: Every evening | ORAL | Status: DC | PRN

## 2011-10-29 NOTE — Telephone Encounter (Signed)
Pt request refill Ambien 5 mg. Pt last seen 07/29/11 and pt can be reached at [phone]. Pt said had already requested refill from [facility]. but has not been notified refill was done so pt called office to get refilled.

## 2011-10-29 NOTE — Telephone Encounter (Signed)
Rx called in as directed.   

## 2011-10-29 NOTE — Telephone Encounter (Signed)
Patient notified as instructed by telephone. Pt will wait to hear from pt care coordinator about [facility] referral.Pt will pick up Nexium samples at front desk later this week. Nexium 40 mg samples #30 Lot # [id number] and exp date 02/2014.

## 2011-11-10 ENCOUNTER — Other Ambulatory Visit: Payer: Self-pay | Admitting: Family Medicine

## 2011-11-11 ENCOUNTER — Ambulatory Visit: Payer: Self-pay | Admitting: Family Medicine

## 2011-11-19 ENCOUNTER — Ambulatory Visit: Payer: Self-pay | Admitting: Family Medicine

## 2011-11-19 ENCOUNTER — Telehealth: Payer: Self-pay

## 2011-11-19 NOTE — Telephone Encounter (Signed)
Pt left v/m she will going out of town next week and request Nexium 40 mg samples. Pt can be reached at [phone] or [phone].Please advise.

## 2011-11-19 NOTE — Telephone Encounter (Signed)
Ok to give samples

## 2011-11-19 NOTE — Telephone Encounter (Signed)
5 sample boxes of # each given.  Lot number [id number], exp 02/2014.  Pt to pick up.

## 2011-12-18 ENCOUNTER — Telehealth: Payer: Self-pay | Admitting: *Deleted

## 2011-12-18 NOTE — Telephone Encounter (Signed)
Pt is asking for a months worth of nexium samples.  # 6 boxes of 5 each given, lot number [id number], EXP 06/2013.  Placed up front for pick up, pt is aware.

## 2011-12-20 ENCOUNTER — Ambulatory Visit: Payer: Self-pay | Admitting: Family Medicine

## 2011-12-24 ENCOUNTER — Other Ambulatory Visit: Payer: Self-pay | Admitting: *Deleted

## 2011-12-24 MED ORDER — VARENICLINE TARTRATE 1 MG PO TABS: 1.0000 mg | ORAL_TABLET | Freq: Two times a day (BID) | ORAL | Status: DC

## 2011-12-24 NOTE — Telephone Encounter (Signed)
[facility] [address], last filled # [phone] on 11/10/11.

## 2012-01-01 ENCOUNTER — Other Ambulatory Visit: Payer: Self-pay | Admitting: Family Medicine

## 2012-01-01 DIAGNOSIS — E119 Type 2 diabetes mellitus without complications: Secondary | ICD-10-CM

## 2012-01-02 ENCOUNTER — Other Ambulatory Visit (INDEPENDENT_AMBULATORY_CARE_PROVIDER_SITE_OTHER): Payer: BC Managed Care – PPO

## 2012-01-02 DIAGNOSIS — E119 Type 2 diabetes mellitus without complications: Secondary | ICD-10-CM

## 2012-01-07 ENCOUNTER — Ambulatory Visit: Payer: Self-pay | Admitting: Otolaryngology

## 2012-01-08 ENCOUNTER — Ambulatory Visit: Payer: BC Managed Care – PPO | Admitting: Family Medicine

## 2012-01-08 ENCOUNTER — Encounter: Payer: Self-pay | Admitting: Family Medicine

## 2012-01-08 ENCOUNTER — Ambulatory Visit (INDEPENDENT_AMBULATORY_CARE_PROVIDER_SITE_OTHER): Payer: BC Managed Care – PPO | Admitting: Family Medicine

## 2012-01-08 VITALS — BP 123/80 | HR 88 | Temp 98.7°F | Wt 184.0 lb

## 2012-01-08 DIAGNOSIS — E119 Type 2 diabetes mellitus without complications: Secondary | ICD-10-CM

## 2012-01-08 DIAGNOSIS — G4700 Insomnia, unspecified: Secondary | ICD-10-CM

## 2012-01-08 MED ORDER — ZOLPIDEM TARTRATE ER 6.25 MG PO TBCR: EXTENDED_RELEASE_TABLET | ORAL | Status: DC

## 2012-01-08 NOTE — Patient Instructions (Addendum)
Great to see you. You look fabulous! Call me if you're having night time awakening. The other thing you try is over the counter melatonin.

## 2012-01-08 NOTE — Progress Notes (Signed)
Subjective:    Patient ID: Connie Maldonado, female    DOB: 09/16/1955, 56 y.o.   MRN: [id number]  56 yo here for follow up.  DM- Metformin 500 mg twice daily.  Exercising- zumba classes. Taking phentermine through bariatric center.  Has lost 20 pounds total. Wt Readings from Last 3 Encounters:  01/08/12 184 lb (83.462 kg)  07/29/11 191 lb 12 oz (86.977 kg)  05/09/11 197 lb 3.2 oz (89.449 kg)     Denies any increased thirst or urination. Lab Results  Component Value Date   HGBA1C 6.6* 01/02/2012   Insomnia- Ambien Cr has helped with her night time awakenings.  Recently had sleep study done at [facility]. Awaiting results.  Patient Active Problem List  Diagnosis  . HYPERLIPIDEMIA  . TOBACCO ABUSE  . ADJUSTMENT DISORDER WITH ANXIETY  . HYPERTENSION  . GERD  . SKIN RASH  . Routine general medical examination at a health care facility  . Hyperglycemia  . Elevated liver function tests  . Diabetes mellitus  . Insomnia   Past Medical History  Diagnosis Date  . GERD (gastroesophageal reflux disease)   . Hyperlipidemia   . Hypertension   . Anxiety    Past Surgical History  Procedure Date  . Tubal ligation   . [place]    6 mm hyperplastic polyp, internal hemorrhoids  . [place]ophagogast[place] ([place]    reflux esophagitis, gastritis   History  Substance Use Topics  . Smoking status: Former Games developer  . Smokeless tobacco: Never Used  . Alcohol Use: 1.0 oz/week    2 drink(s) per week   Family History  Problem Relation Age of Onset  . Heart disease Mother   . Hypertension Mother   . Cancer Father     stomach  . Stomach cancer Father 27  . Colon cancer Neg Hx    No Known Allergies Current Outpatient Prescriptions on File Prior to Visit  Medication Sig Dispense Refill  . ALPRAZolam (XANAX) 0.25 MG tablet Take one tablet by mouth three times a day as needed for anxiety  90 tablet  0  . amLODipine-valsartan (EXFORGE) 10-160 MG per tablet  Take 1 tablet by mouth daily.  30 tablet  6  . Blood Glucose Monitoring Suppl (ONE TOUCH BASIC SYSTEM) W/DEVICE KIT Use as directed.  1 each  0  . esomeprazole (NEXIUM) 40 MG capsule Take 40 mg by mouth daily before breakfast.        . HYDROcodone-acetaminophen (VICODIN) 5-500 MG per tablet Take 1 tablet by mouth every 6 (six) hours as needed.        . metFORMIN (GLUCOPHAGE) 500 MG tablet Take 1 tablet (500 mg total) by mouth 2 (two) times daily with a meal.  180 tablet  3  . methocarbamol (ROBAXIN) 500 MG tablet Take 500 mg by mouth daily.        . simvastatin (ZOCOR) 40 MG tablet TAKE 1 TABLET BY MOUTH ONCE A DAY  30 tablet  7   The PMH, PSH, Social History, Family History, Medications, and allergies have been reviewed in [facility], and have been updated if relevant.   Review of Systems See HPI Patient reports no  vision/ hearing changes,anorexia, weight change, fever ,adenopathy, persistant / recurrent hoarseness, swallowing issues, chest pain, edema,persistant / recurrent cough, hemoptysis, dyspnea(rest, exertional, paroxysmal nocturnal), gastrointestinal  bleeding (melena, rectal bleeding), abdominal pain, excessive heart burn, GU symptoms(dysuria, hematuria, pyuria, voiding/incontinence  Issues) syncope, focal weakness, severe memory loss, concerning skin lesions, depression, anxiety, abnormal  bruising/bleeding, major joint swelling, breast masses or abnormal vaginal bleeding.       Objective:   Physical Exam BP 123/80  Pulse 88  Temp 98.7 F (37.1 C) (Oral)  Wt 184 lb (83.462 kg) Wt Readings from Last 3 Encounters:  01/08/12 184 lb (83.462 kg)  07/29/11 191 lb 12 oz (86.977 kg)  05/09/11 197 lb 3.2 oz (89.449 kg)    General:  Well-developed,well-nourished,in no acute distress; alert,appropriate and cooperative throughout examination Head:  normocephalic and atraumatic.   Eyes:  vision grossly intact, pupils equal, pupils round, and pupils reactive to light.   Ears:  R ear normal and  L ear normal.   Nose:  no external deformity.   Mouth:  good dentition.   Lungs:  Normal respiratory effort, chest expands symmetrically. Lungs are clear to auscultation, no crackles or wheezes. Heart:  Normal rate and regular rhythm. S1 and S2 normal without gallop, murmur, click, rub or other extra sounds. Msk:  No deformity or scoliosis noted of thoracic or lumbar spine.   Extremities:  No clubbing, cyanosis, edema, or deformity noted with normal full range of motion of all joints.   Neurologic:  alert & oriented X3 and gait normal.   Skin:  Intact without suspicious lesions or rashes Psych:  Cognition and judgment appear intact. Alert and cooperative with normal attention span and concentration. No apparent delusions, illusions, hallucinations        Assessment & Plan:   1. Diabetes mellitus  Improved.  Congratulated her on weight loss and improved lifestyle. Follow up in 6 months.  2. Insomnia  Improved.  Refilled ambien CR.  No indication of abuse.

## 2012-02-17 ENCOUNTER — Telehealth: Payer: Self-pay | Admitting: *Deleted

## 2012-02-17 NOTE — Telephone Encounter (Signed)
Pt called requesting samples of nexium.  # 6 boxes of 5 each given.  Lot number [id number], exp 03/2014.  Pt to pick up.

## 2012-03-22 ENCOUNTER — Other Ambulatory Visit: Payer: Self-pay | Admitting: Family Medicine

## 2012-03-24 ENCOUNTER — Telehealth: Payer: Self-pay

## 2012-03-24 NOTE — Telephone Encounter (Signed)
6 boxes of nexium, number 5 capsules in each box, given to patient.  Lot number [id number], exp 03/2014.  Pt to pick up.

## 2012-03-24 NOTE — Telephone Encounter (Signed)
Pt request Nexium samples. Call when ready for pick up if available.

## 2012-03-31 ENCOUNTER — Other Ambulatory Visit: Payer: Self-pay | Admitting: *Deleted

## 2012-03-31 MED ORDER — ZOLPIDEM TARTRATE ER 6.25 MG PO TBCR: EXTENDED_RELEASE_TABLET | ORAL | Status: DC

## 2012-03-31 NOTE — Telephone Encounter (Signed)
[facility] 

## 2012-03-31 NOTE — Telephone Encounter (Signed)
[facility] [address], last filled 01/08/12.

## 2012-04-21 ENCOUNTER — Telehealth: Payer: Self-pay | Admitting: *Deleted

## 2012-04-21 NOTE — Telephone Encounter (Signed)
Pt is requesting samples of nexium.  Ok to give 6 boxes of 5 each, per Dr. Dayton Martes.  Lot number [id number], EXP 09/2013.  Placed at front desk for pick up.

## 2012-05-12 ENCOUNTER — Other Ambulatory Visit: Payer: Self-pay

## 2012-05-12 MED ORDER — ALPRAZOLAM 0.25 MG PO TABS: ORAL_TABLET | ORAL | Status: DC

## 2012-05-12 NOTE — Telephone Encounter (Signed)
[facility]anax 0.25 mg #60 0 R [facility]

## 2012-05-12 NOTE — Telephone Encounter (Signed)
[facility] [address] request refill alprazolam. Last filled 07/30/11.Please advise.

## 2012-05-25 ENCOUNTER — Telehealth: Payer: Self-pay | Admitting: *Deleted

## 2012-05-25 NOTE — Telephone Encounter (Signed)
Pt is requesting a month supply of Nexium samples.

## 2012-05-25 NOTE — Telephone Encounter (Signed)
6 boxes of # 5 each nexium given.  Lot number [id number], EXP 09/2013. Placed up front for pick up.

## 2012-06-24 ENCOUNTER — Encounter: Payer: Self-pay | Admitting: Family Medicine

## 2012-06-24 ENCOUNTER — Ambulatory Visit (INDEPENDENT_AMBULATORY_CARE_PROVIDER_SITE_OTHER): Payer: BC Managed Care – PPO | Admitting: Family Medicine

## 2012-06-24 VITALS — BP 148/92 | HR 72 | Temp 97.9°F | Wt 187.0 lb

## 2012-06-24 DIAGNOSIS — E785 Hyperlipidemia, unspecified: Secondary | ICD-10-CM

## 2012-06-24 DIAGNOSIS — E119 Type 2 diabetes mellitus without complications: Secondary | ICD-10-CM

## 2012-06-24 DIAGNOSIS — G4700 Insomnia, unspecified: Secondary | ICD-10-CM

## 2012-06-24 DIAGNOSIS — I10 Essential (primary) hypertension: Secondary | ICD-10-CM

## 2012-06-24 DIAGNOSIS — Z23 Encounter for immunization: Secondary | ICD-10-CM

## 2012-06-24 DIAGNOSIS — F17200 Nicotine dependence, unspecified, uncomplicated: Secondary | ICD-10-CM

## 2012-06-24 LAB — COMPREHENSIVE METABOLIC PANEL
Albumin: 4.1 g/dL (ref 3.5–5.2)
Alkaline Phosphatase: 72 U/L (ref 39–117)
BUN: 11 mg/dL (ref 6–23)
Creatinine, Ser: 0.7 mg/dL (ref 0.4–1.2)
Glucose, Bld: 99 mg/dL (ref 70–99)
Total Bilirubin: 0.5 mg/dL (ref 0.3–1.2)

## 2012-06-24 MED ORDER — VARENICLINE TARTRATE 1 MG PO TABS: 1.0000 mg | ORAL_TABLET | Freq: Two times a day (BID) | ORAL | Status: DC

## 2012-06-24 NOTE — Progress Notes (Signed)
Subjective:    Patient ID: Connie Maldonado, female    DOB: 1956/05/07, 56 y.o.   MRN: [id number]  56 yo here for follow up.  DM- Metformin 500 mg twice daily.  Exercising- zumba classes. Stopped taking phentermine.  Checks CBGs occasionally (usually once a week)- Usually in 120s.  Denies any increased thirst or urination. Lab Results  Component Value Date   HGBA1C 6.6* 01/02/2012   Insomnia- Ambien Cr has helped with her night time awakenings.  Does not take every night.  Tobacco abuse- wants to try Chantix again.  Feels she is really ready to quit this time.  Patient Active Problem List  Diagnosis  . HYPERLIPIDEMIA  . TOBACCO ABUSE  . ADJUSTMENT DISORDER WITH ANXIETY  . HYPERTENSION  . GERD  . SKIN RASH  . Routine general medical examination at a health care facility  . Hyperglycemia  . Elevated liver function tests  . Diabetes mellitus  . Insomnia   Past Medical History  Diagnosis Date  . GERD (gastroesophageal reflux disease)   . Hyperlipidemia   . Hypertension   . Anxiety    Past Surgical History  Procedure Date  . Tubal ligation   . [place]    6 mm hyperplastic polyp, internal hemorrhoids  . [place] ([place]    reflux esophagitis, gastritis   History  Substance Use Topics  . Smoking status: Former Games developer  . Smokeless tobacco: Never Used  . Alcohol Use: 1.0 oz/week    2 drink(s) per week   Family History  Problem Relation Age of Onset  . Heart disease Mother   . Hypertension Mother   . Cancer Father     stomach  . Stomach cancer Father 12  . Colon cancer Neg Hx    No Known Allergies Current Outpatient Prescriptions on File Prior to Visit  Medication Sig Dispense Refill  . ALPRAZolam (XANAX) 0.25 MG tablet Take one tablet by mouth three times a day as needed for anxiety  90 tablet  0  . Blood Glucose Monitoring Suppl (ONE TOUCH BASIC SYSTEM) W/DEVICE KIT Use as directed.  1 each  0  . esomeprazole  (NEXIUM) 40 MG capsule Take 40 mg by mouth daily before breakfast.        . EXFORGE 10-160 MG per tablet TAKE 1 TABLET BY MOUTH DAILY.  30 tablet  6  . HYDROcodone-acetaminophen (VICODIN) 5-500 MG per tablet Take 1 tablet by mouth every 6 (six) hours as needed.        . metFORMIN (GLUCOPHAGE) 500 MG tablet Take 1 tablet (500 mg total) by mouth 2 (two) times daily with a meal.  180 tablet  3  . methocarbamol (ROBAXIN) 500 MG tablet Take 500 mg by mouth daily.        . simvastatin (ZOCOR) 40 MG tablet TAKE 1 TABLET BY MOUTH ONCE A DAY  30 tablet  7  . zolpidem (AMBIEN CR) 6.25 MG CR tablet 1-2 tablets as needed for insomnia  60 tablet  0   The PMH, PSH, Social History, Family History, Medications, and allergies have been reviewed in [facility], and have been updated if relevant.   Review of Systems See HPI Patient reports no  vision/ hearing changes,anorexia, weight change, fever ,adenopathy, persistant / recurrent hoarseness, swallowing issues, chest pain, edema,persistant / recurrent cough, hemoptysis, dyspnea(rest, exertional, paroxysmal nocturnal), gastrointestinal  bleeding (melena, rectal bleeding), abdominal pain, excessive heart burn, GU symptoms(dysuria, hematuria, pyuria, voiding/incontinence  Issues) syncope, focal weakness, severe memory loss, concerning  skin lesions, depression, anxiety, abnormal bruising/bleeding, major joint swelling, breast masses or abnormal vaginal bleeding.       Objective:   Physical Exam BP 148/92  Pulse 72  Temp 97.9 F (36.6 C)  Wt 187 lb (84.823 kg) Wt Readings from Last 3 Encounters:  06/24/12 187 lb (84.823 kg)  01/08/12 184 lb (83.462 kg)  07/29/11 191 lb 12 oz (86.977 kg)     General:  Well-developed,well-nourished,in no acute distress; alert,appropriate and cooperative throughout examination Head:  normocephalic and atraumatic.   Eyes:  vision grossly intact, pupils equal, pupils round, and pupils reactive to light.   Ears:  R ear normal and L ear  normal.   Nose:  no external deformity.   Mouth:  good dentition.   Lungs:  Normal respiratory effort, chest expands symmetrically. Lungs are clear to auscultation, no crackles or wheezes. Heart:  Normal rate and regular rhythm. S1 and S2 normal without gallop, murmur, click, rub or other extra sounds. Msk:  No deformity or scoliosis noted of thoracic or lumbar spine.   Extremities:  No clubbing, cyanosis, edema, or deformity noted with normal full range of motion of all joints.   Neurologic:  alert & oriented X3 and gait normal.   Skin:  Intact without suspicious lesions or rashes Psych:  Cognition and judgment appear intact. Alert and cooperative with normal attention span and concentration. No apparent delusions, illusions, hallucinations        Assessment & Plan:   1. Diabetes mellitus  Improved.  Congratulated her on maintaining her weight and improved lifestyle. Follow up in 6 months. Orders Placed This Encounter  Procedures  . Hemoglobin A1c  . Comprehensive metabolic panel     2. Tobacco abuse Chantix refilled.

## 2012-06-24 NOTE — Addendum Note (Signed)
Addended by: Eliezer Bottom on: 06/24/2012 10:36 AM   Modules accepted: Orders

## 2012-06-24 NOTE — Patient Instructions (Addendum)
Great to see you. We will call you with your lab results.  I'm really proud of you for wanting to quit smoking.

## 2012-06-25 ENCOUNTER — Encounter: Payer: Self-pay | Admitting: *Deleted

## 2012-07-06 ENCOUNTER — Ambulatory Visit: Payer: BC Managed Care – PPO | Admitting: Family Medicine

## 2012-07-24 ENCOUNTER — Other Ambulatory Visit: Payer: Self-pay | Admitting: Family Medicine

## 2012-07-26 ENCOUNTER — Telehealth: Payer: Self-pay | Admitting: *Deleted

## 2012-07-26 NOTE — Telephone Encounter (Signed)
Pt requests samples of nexium.  Number 6 sample boxes of 5 tablets each given.  Lot number [id number], exp 04/2014.  Patient to pick up.

## 2012-08-24 ENCOUNTER — Telehealth: Payer: Self-pay | Admitting: Family Medicine

## 2012-08-24 NOTE — Telephone Encounter (Signed)
Call regarding: Med refill for Nexium; says she usually gets free samples from [facility]; has one day left; please call back

## 2012-08-24 NOTE — Telephone Encounter (Signed)
6 sample boxes of # 5 each given.  Lot number [id number], EXP 03/2014.  Pt also requests skelaxin samples- # 2 packs of 4 tablets each given.  Lot number [id number], exp 01/2013.  Placed at front desk for pick up.

## 2012-09-20 ENCOUNTER — Telehealth: Payer: Self-pay | Admitting: Family Medicine

## 2012-09-20 NOTE — Telephone Encounter (Signed)
Caller: Connie Maldonado/Patient; Phone: [phone]; Reason for Call: Patient states she would like to know if she can get samples of Nexium?  PLEASE F/U WITH PT, THANK YOU.

## 2012-09-21 NOTE — Telephone Encounter (Signed)
6 boxes of samples given- lot number [id number]. Exp 01/2015.  Placed at front desk for pick up, advised patient.

## 2012-09-21 NOTE — Telephone Encounter (Signed)
Yes ok to give samples.

## 2012-09-29 ENCOUNTER — Encounter: Payer: Self-pay | Admitting: Family Medicine

## 2012-09-29 ENCOUNTER — Ambulatory Visit (INDEPENDENT_AMBULATORY_CARE_PROVIDER_SITE_OTHER): Payer: BC Managed Care – PPO | Admitting: Family Medicine

## 2012-09-29 VITALS — BP 118/80 | HR 72 | Temp 97.7°F | Wt 185.0 lb

## 2012-09-29 DIAGNOSIS — H9201 Otalgia, right ear: Secondary | ICD-10-CM

## 2012-09-29 DIAGNOSIS — K602 Anal fissure, unspecified: Secondary | ICD-10-CM | POA: Insufficient documentation

## 2012-09-29 DIAGNOSIS — H9209 Otalgia, unspecified ear: Secondary | ICD-10-CM

## 2012-09-29 DIAGNOSIS — K649 Unspecified hemorrhoids: Secondary | ICD-10-CM

## 2012-09-29 MED ORDER — PANTOPRAZOLE SODIUM 40 MG PO TBEC: 40.0000 mg | DELAYED_RELEASE_TABLET | Freq: Every day | ORAL | Status: DC

## 2012-09-29 NOTE — Progress Notes (Signed)
Subjective:    Patient ID: Connie Maldonado, female    DOB: 09/20/1955, 57 y.o.   MRN: [id number]  HPI  57 yo here for:  1.  Hemorrhoids- has had bright red blood and rectal pain for past 2 weeks.  Was constipated prior to this starting.  Does have a h/o hemorrhoids.  2.  Sore in right ear-  Right ear was sore but it has resolved.  She could feel like there was a pimple in her ear but can no longer feel it.    Patient Active Problem List  Diagnosis  . HYPERLIPIDEMIA  . TOBACCO ABUSE  . ADJUSTMENT DISORDER WITH ANXIETY  . HYPERTENSION  . GERD  . SKIN RASH  . Routine general medical examination at a health care facility  . Hyperglycemia  . Elevated liver function tests  . Diabetes mellitus  . Insomnia   Past Medical History  Diagnosis Date  . GERD (gastroesophageal reflux disease)   . Hyperlipidemia   . Hypertension   . Anxiety    Past Surgical History  Procedure Laterality Date  . Tubal ligation    . [place]  [place]    6 mm hyperplastic polyp, internal hemorrhoids  . Esophagogast[place]oscopy  [place] ([place]    reflux esophagitis, gastritis   History  Substance Use Topics  . Smoking status: Former Games developer  . Smokeless tobacco: Never Used     Comment: Quit 07/2012  . Alcohol Use: 1.0 oz/week    2 drink(s) per week   Family History  Problem Relation Age of Onset  . Heart disease Mother   . Hypertension Mother   . Cancer Father     stomach  . Stomach cancer Father 38  . Colon cancer Neg Hx    No Known Allergies Current Outpatient Prescriptions on File Prior to Visit  Medication Sig Dispense Refill  . ALPRAZolam (XANAX) 0.25 MG tablet Take one tablet by mouth three times a day as needed for anxiety  90 tablet  0  . Blood Glucose Monitoring Suppl (ONE TOUCH BASIC SYSTEM) W/DEVICE KIT Use as directed.  1 each  0  . esomeprazole (NEXIUM) 40 MG capsule Take 40 mg by mouth daily before breakfast.        . EXFORGE 10-160 MG per tablet TAKE 1 TABLET BY  MOUTH DAILY.  30 tablet  6  . HYDROcodone-acetaminophen (VICODIN) 5-500 MG per tablet Take 1 tablet by mouth every 6 (six) hours as needed.        . metaxalone (SKELAXIN) 800 MG tablet Take one by mouth at bedtime      . metFORMIN (GLUCOPHAGE) 500 MG tablet TAKE 1 TABLET (500 MG TOTAL) BY MOUTH 2 (TWO) TIMES DAILY WITH A MEAL.  180 tablet  3  . simvastatin (ZOCOR) 40 MG tablet TAKE 1 TABLET BY MOUTH ONCE A DAY  30 tablet  7  . zolpidem (AMBIEN CR) 6.25 MG CR tablet 1-2 tablets as needed for insomnia  60 tablet  0   No current facility-administered medications on file prior to visit.   The PMH, PSH, Social History, Family History, Medications, and allergies have been reviewed in [facility], and have been updated if relevant.   Review of Systems See HPI No abdominal pain No fever    Objective:   Physical Exam BP 118/80  Pulse 72  Temp(Src) 97.7 F (36.5 C)  Wt 185 lb (83.915 kg)  BMI 28.97 kg/m2  General:  Well-developed,well-nourished,in no acute distress; alert,appropriate and cooperative throughout examination Head:  normocephalic and atraumatic.   Eyes:  vision grossly intact, pupils equal, pupils round, and pupils reactive to light.   Ears:  R ear normal and L ear normal.   Nose:  no external deformity.   Mouth:  good dentition.   Neck:  No deformities, masses, or tenderness noted. Breasts:  No mass, nodules, thickening, tenderness, bulging, retraction, inflamation, nipple discharge or skin changes noted.   Lungs:  Normal respiratory effort, chest expands symmetrically. Lungs are clear to auscultation, no crackles or wheezes. Heart:  Normal rate and regular rhythm. S1 and S2 normal without gallop, murmur, click, rub or other extra sounds. Abdomen:  Bowel sounds positive,abdomen soft and non-tender without masses, organomegaly or hernias noted. Rectal:  no external abnormalities.   No hemorrhoids Does have a small anal fissure   Extremities:  No clubbing, cyanosis, edema, or  deformity noted with normal full range of motion of all joints.   Neurologic:  alert & oriented X3 and gait normal.   Skin:  Intact without suspicious lesions or rashes Psych:  Cognition and judgment appear intact. Alert and cooperative with normal attention span and concentration. No apparent delusions, illusions, hallucinations        Assessment & Plan:  1. Anal fissure-  Discussed course and management of anal fissures.  See AVS for details. No hemorrhoids noted. Call or return to clinic prn if these symptoms worsen or fail to improve as anticipated.   2. Pain in right ear Resolved with no abnormalities on exam. Reassurance provided.

## 2012-09-29 NOTE — Patient Instructions (Signed)
Anal Fissure, Adult  An anal fissure is a small tear or crack in the skin around the anus. Bleeding from a fissure usually stops on its own within a few minutes. However, bleeding will often reoccur with each bowel movement until the crack heals.   CAUSES    Passing large, hard stools.   Frequent diarrheal stools.   Constipation.   Inflammatory bowel disease (Crohn's disease or ulcerative colitis).   Infections.   Anal sex.  SYMPTOMS    Small amounts of blood seen on your stools, on toilet paper, or in the toilet after a bowel movement.   Rectal bleeding.   Painful bowel movements.   Itching or irritation around the anus.  DIAGNOSIS  Your caregiver will examine the anal area. An anal fissure can usually be seen with careful inspection. A rectal exam may be performed and a short tube (anoscope) may be used to examine the anal canal.  TREATMENT    You may be instructed to take fiber supplements. These supplements can soften your stool to help make bowel movements easier.   Sitz baths may be recommended to help heal the tear. Do not use soap in the sitz baths.   A medicated cream or ointment may be prescribed to lessen discomfort.  HOME CARE INSTRUCTIONS    Maintain a diet high in fruits, whole grains, and vegetables. Avoid constipating foods like bananas and dairy products.   Take sitz baths as directed by your caregiver.   Drink enough fluids to keep your urine clear or pale yellow.   Only take over-the-counter or prescription medicines for pain, discomfort, or fever as directed by your caregiver. Do not take aspirin as this may increase bleeding.   Do not use ointments containing numbing medications (anesthetics) or hydrocortisone. They could slow healing.  SEEK MEDICAL CARE IF:    Your fissure is not completely healed within 3 days.   You have further bleeding.   You have a fever.   You have diarrhea mixed with blood.   You have pain.   Your problem is getting worse rather than  better.  MAKE SURE YOU:    Understand these instructions.   Will watch your condition.   Will get help right away if you are not doing well or get worse.  Document Released: 07/07/2005 Document Revised: 09/29/2011 Document Reviewed: 12/22/2010  [employer] Patient Information 2013 [employer].

## 2012-10-14 ENCOUNTER — Other Ambulatory Visit: Payer: Self-pay | Admitting: *Deleted

## 2012-10-14 NOTE — Telephone Encounter (Signed)
[facility]ed [facility]ill requests [facility], xanax last filled 05/12/12.  ambien last filled 04/06/12

## 2012-10-15 MED ORDER — ALPRAZOLAM 0.25 MG PO TABS: ORAL_TABLET | ORAL | Status: DC

## 2012-10-15 MED ORDER — AMLODIPINE BESYLATE-VALSARTAN 10-160 MG PO TABS: ORAL_TABLET | ORAL | Status: DC

## 2012-10-15 MED ORDER — ZOLPIDEM TARTRATE ER 6.25 MG PO TBCR: EXTENDED_RELEASE_TABLET | ORAL | Status: DC

## 2012-10-15 NOTE — Telephone Encounter (Signed)
ambien and xanax called to [facility].

## 2012-10-18 ENCOUNTER — Other Ambulatory Visit: Payer: Self-pay | Admitting: Family Medicine

## 2012-11-02 ENCOUNTER — Telehealth: Payer: Self-pay | Admitting: *Deleted

## 2012-11-02 NOTE — Telephone Encounter (Signed)
Patient requests nexium samples.  6 boxes of #5 each given.  Lot number [id number], exp 01/2015.

## 2012-12-07 ENCOUNTER — Telehealth: Payer: Self-pay | Admitting: *Deleted

## 2012-12-07 NOTE — Telephone Encounter (Signed)
Pt is asking for nexium samples.  # 6 boxes of 5 each given- lot number [id number], exp 02/2015.

## 2012-12-24 ENCOUNTER — Encounter: Payer: Self-pay | Admitting: Radiology

## 2012-12-27 ENCOUNTER — Encounter: Payer: Self-pay | Admitting: Family Medicine

## 2012-12-27 ENCOUNTER — Ambulatory Visit (INDEPENDENT_AMBULATORY_CARE_PROVIDER_SITE_OTHER): Payer: BC Managed Care – PPO | Admitting: Family Medicine

## 2012-12-27 VITALS — BP 140/80 | HR 88 | Temp 98.4°F | Wt 185.0 lb

## 2012-12-27 DIAGNOSIS — K648 Other hemorrhoids: Secondary | ICD-10-CM

## 2012-12-27 MED ORDER — HYDROCORTISONE ACETATE 25 MG RE SUPP: 25.0000 mg | Freq: Two times a day (BID) | RECTAL | Status: DC

## 2012-12-27 NOTE — Patient Instructions (Addendum)
Good to see you. You do have an internal hemorrhoid. Use hydrocortisone suppositories (no longer than 1 week). Increase fibers as we discussed. Call me if it does not continue to improve.

## 2012-12-27 NOTE — Progress Notes (Signed)
Subjective:    Patient ID: Connie Maldonado, female    DOB: [birth year], 57 y.o.   MRN: [id number]  HPI  57 yo here for:    Hemorrhoids- I saw her in 09/2012 for bright red blood and rectal pain for past 2 weeks.  Was constipated prior to this starting.  Does have a h/o hemorrhoids. On exam, no hemorrhoid- external or internal noted but she did have internal hemorrhoids on colonoscopy from 04/2011- procedure note reviewed.    She did have a small fissure on exam in March, and I advised supportive care.  Those symptoms resolved.  Two weeks ago, anal itching, bleeding.  Symptoms have improved quite a bit- using sitz baths.  Has not been constipated.      Patient Active Problem List   Diagnosis Date Noted  . Anal fissure 09/29/2012  . Pain in right ear 09/29/2012  . Insomnia 01/08/2012  . Diabetes mellitus 07/29/2011  . Routine general medical examination at a health care facility 04/17/2011  . Hyperglycemia 04/17/2011  . Elevated liver function tests 04/17/2011  . SKIN RASH 09/16/2010  . TOBACCO ABUSE 04/16/2010  . ADJUSTMENT DISORDER WITH ANXIETY 10/23/2009  . HYPERLIPIDEMIA 05/16/2009  . HYPERTENSION 05/16/2009  . GERD 05/16/2009   Past Medical History  Diagnosis Date  . GERD (gastroesophageal reflux disease)   . Hyperlipidemia   . Hypertension   . Anxiety    Past Surgical History  Procedure Laterality Date  . Tubal ligation    . [place]  [place] ([place]    6 mm hyperplastic polyp, internal hemorrhoids  . Esophagogastroduodenoscopy  06/12/2008 ([place]    reflux esophagitis, gastritis   History  Substance Use Topics  . Smoking status: Former Games developer  . Smokeless tobacco: Never Used     Comment: Quit 07/2012  . Alcohol Use: 1.0 oz/week    2 drink(s) per week   Family History  Problem Relation Age of Onset  . Heart disease Mother   . Hypertension Mother   . Cancer Father     stomach  . Stomach cancer Father 75  . Colon cancer Neg Hx    No Known  Allergies Current Outpatient Prescriptions on File Prior to Visit  Medication Sig Dispense Refill  . ALPRAZolam (XANAX) 0.25 MG tablet Take one tablet by mouth three times a day as needed for anxiety  90 tablet  0  . amLODipine-valsartan (EXFORGE) 10-160 MG per tablet TAKE 1 TABLET BY MOUTH DAILY.  30 tablet  5  . Blood Glucose Monitoring Suppl (ONE TOUCH BASIC SYSTEM) W/DEVICE KIT Use as directed.  1 each  0  . EXFORGE 10-160 MG per tablet TAKE 1 TABLET BY MOUTH DAILY.  30 tablet  5  . HYDROcodone-acetaminophen (VICODIN) 5-500 MG per tablet Take 1 tablet by mouth every 6 (six) hours as needed.        . metaxalone (SKELAXIN) 800 MG tablet Take one by mouth at bedtime      . metFORMIN (GLUCOPHAGE) 500 MG tablet TAKE 1 TABLET (500 MG TOTAL) BY MOUTH 2 (TWO) TIMES DAILY WITH A MEAL.  180 tablet  3  . pantoprazole (PROTONIX) 40 MG tablet Take 1 tablet (40 mg total) by mouth daily.  30 tablet  3  . simvastatin (ZOCOR) 40 MG tablet TAKE 1 TABLET BY MOUTH ONCE A DAY  30 tablet  7  . zolpidem (AMBIEN CR) 6.25 MG CR tablet 1-2 tablets as needed for insomnia  60 tablet  0   No current facility-administered medications  on file prior to visit.   The PMH, PSH, Social History, Family History, Medications, and allergies have been reviewed in [facility], and have been updated if relevant.   Review of Systems See HPI No abdominal pain No fever    Objective:   Physical Exam BP 140/80  Pulse 88  Temp(Src) 98.4 F (36.9 C)  Wt 185 lb (83.915 kg)  BMI 28.97 kg/m2  General:  Well-developed,well-nourished,in no acute distress; alert,appropriate and cooperative throughout examination Head:  normocephalic and atraumatic.   Eyes:  vision grossly intact, pupils equal, pupils round, and pupils reactive to light.   Ears:  R ear normal and L ear normal.   Nose:  no external deformity.   Mouth:  good dentition.   Neck:  No deformities, masses, or tenderness noted. Breasts:  No mass, nodules, thickening,  tenderness, bulging, retraction, inflamation, nipple discharge or skin changes noted.   Lungs:  Normal respiratory effort, chest expands symmetrically. Lungs are clear to auscultation, no crackles or wheezes. Heart:  Normal rate and regular rhythm. S1 and S2 normal without gallop, murmur, click, rub or other extra sounds. Abdomen:  Bowel sounds positive,abdomen soft and non-tender without masses, organomegaly or hernias noted. Rectal:  no external abnormalities.   No external hemorrhoids- does have small anal tags, fissure has resolved. Small internal hemorrhoid Extremities:  No clubbing, cyanosis, edema, or deformity noted with normal full range of motion of all joints.   Neurologic:  alert & oriented X3 and gait normal.   Skin:  Intact without suspicious lesions or rashes Psych:  Cognition and judgment appear intact. Alert and cooperative with normal attention span and concentration. No apparent delusions, illusions, hallucinations        Assessment & Plan:  1. Internal hemorrhoids Deteriorated but symptoms now resolving. Advised hydrocortisone suppositories, no longer than 1 week, continue sitz baths. Increase fiber. Call or return to clinic prn if these symptoms worsen or fail to improve as anticipated. The patient indicates understanding of these issues and agrees with the plan.

## 2013-01-10 ENCOUNTER — Other Ambulatory Visit: Payer: Self-pay | Admitting: Family Medicine

## 2013-01-10 MED ORDER — ZOLPIDEM TARTRATE ER 6.25 MG PO TBCR: EXTENDED_RELEASE_TABLET | ORAL | Status: DC

## 2013-01-10 NOTE — Telephone Encounter (Signed)
[facility].

## 2013-01-10 NOTE — Telephone Encounter (Signed)
Fax refill request.

## 2013-01-11 ENCOUNTER — Encounter: Payer: Self-pay | Admitting: Family Medicine

## 2013-01-25 ENCOUNTER — Telehealth: Payer: Self-pay

## 2013-01-25 NOTE — Telephone Encounter (Signed)
Left message asking patient to call back

## 2013-01-25 NOTE — Telephone Encounter (Signed)
Pt left v/m requesting samples of Nexium; pt would like to pick up on 01/26/13; pt request cb.

## 2013-01-26 NOTE — Telephone Encounter (Signed)
6 sample boxes of #5 each given to patient.  Lot number [id number], exp 03/2015.

## 2013-02-21 ENCOUNTER — Telehealth: Payer: Self-pay | Admitting: *Deleted

## 2013-02-21 NOTE — Telephone Encounter (Signed)
Pt requests samples of nexium.  # 3 boxes of 5 each given.  Lot number [id number], exp 03/2015.  Pt to pick up.

## 2013-02-22 ENCOUNTER — Other Ambulatory Visit: Payer: Self-pay | Admitting: Family Medicine

## 2013-02-22 MED ORDER — PANTOPRAZOLE SODIUM 40 MG PO TBEC: 40.0000 mg | DELAYED_RELEASE_TABLET | Freq: Every day | ORAL | Status: DC

## 2013-03-08 ENCOUNTER — Other Ambulatory Visit: Payer: Self-pay | Admitting: *Deleted

## 2013-03-08 MED ORDER — SIMVASTATIN 40 MG PO TABS: ORAL_TABLET | ORAL | Status: DC

## 2013-03-23 ENCOUNTER — Other Ambulatory Visit: Payer: Self-pay | Admitting: *Deleted

## 2013-03-23 MED ORDER — AMLODIPINE BESYLATE-VALSARTAN 10-160 MG PO TABS: ORAL_TABLET | ORAL | Status: DC

## 2013-03-25 NOTE — Progress Notes (Signed)
Encounter was used for lab visit only.

## 2013-04-11 ENCOUNTER — Encounter: Payer: Self-pay | Admitting: Family Medicine

## 2013-04-11 ENCOUNTER — Ambulatory Visit (INDEPENDENT_AMBULATORY_CARE_PROVIDER_SITE_OTHER): Payer: BC Managed Care – PPO | Admitting: Family Medicine

## 2013-04-11 VITALS — BP 136/80 | HR 101 | Temp 98.3°F | Ht 67.0 in | Wt 184.0 lb

## 2013-04-11 DIAGNOSIS — Z23 Encounter for immunization: Secondary | ICD-10-CM

## 2013-04-11 DIAGNOSIS — I10 Essential (primary) hypertension: Secondary | ICD-10-CM

## 2013-04-11 DIAGNOSIS — E785 Hyperlipidemia, unspecified: Secondary | ICD-10-CM

## 2013-04-11 DIAGNOSIS — F17200 Nicotine dependence, unspecified, uncomplicated: Secondary | ICD-10-CM

## 2013-04-11 DIAGNOSIS — E119 Type 2 diabetes mellitus without complications: Secondary | ICD-10-CM

## 2013-04-11 LAB — COMPREHENSIVE METABOLIC PANEL
Albumin: 4.5 g/dL (ref 3.5–5.2)
Alkaline Phosphatase: 62 U/L (ref 39–117)
CO2: 27 mEq/L (ref 19–32)
GFR: 88.72 mL/min (ref >60.00)
Glucose, Bld: 108 mg/dL — ABNORMAL HIGH (ref 70–99)
Potassium: 4 mEq/L (ref 3.5–5.1)
Sodium: 138 mEq/L (ref 135–145)
Total Protein: 8 g/dL (ref 6.0–8.3)

## 2013-04-11 LAB — LIPID PANEL
Cholesterol: 174 mg/dL (ref 0–200)
LDL Cholesterol: 95 mg/dL (ref 0–99)

## 2013-04-11 MED ORDER — BUPROPION HCL ER (SMOKING DET) 150 MG PO TB12: 150.0000 mg | ORAL_TABLET | Freq: Two times a day (BID) | ORAL | Status: DC

## 2013-04-11 MED ORDER — TRAZODONE HCL 50 MG PO TABS: 25.0000 mg | ORAL_TABLET | Freq: Every evening | ORAL | Status: DC | PRN

## 2013-04-11 NOTE — Patient Instructions (Addendum)
Good to see you. We are starting Trazadone for sleep as well as Zyban (or Wellbutrin) for smoking.  Please keep me updated.  We will call you with your lab results.

## 2013-04-11 NOTE — Progress Notes (Signed)
Subjective:    Patient ID: Connie Maldonado, female    DOB: Nov 20, 1955, 57 y.o.   MRN: [id number]  57 yo pleasant female here for follow up.  DM- Metformin 500 mg twice daily.  Exercising- zumba classes. Wt Readings from Last 3 Encounters:  04/11/13 184 lb (83.462 kg)  12/27/12 185 lb (83.915 kg)  09/29/12 185 lb (83.915 kg)     Checks CBGs occasionally (usually once a week)- Usually in 120s- 130s. Denies any episodes of hypoglycemia.  Denies any increased thirst or urination. Lab Results  Component Value Date   HGBA1C 6.7* 06/24/2012   Insomnia- Ambien Cr has helped with her night time awakenings but very expensive.  Does not take every night.  Tobacco abuse- tried Chantix.  Did not make her feel bad and thought it worked well but too expensive.  She has cut back to 1-2 cigs per day. She really wants to quit.  Patient Active Problem List   Diagnosis Date Noted  . Internal hemorrhoids 12/27/2012  . Anal fissure 09/29/2012  . Pain in right ear 09/29/2012  . Insomnia 01/08/2012  . Diabetes mellitus 07/29/2011  . Hyperglycemia 04/17/2011  . Elevated liver function tests 04/17/2011  . SKIN RASH 09/16/2010  . TOBACCO ABUSE 04/16/2010  . ADJUSTMENT DISORDER WITH ANXIETY 10/23/2009  . HYPERLIPIDEMIA 05/16/2009  . HYPERTENSION 05/16/2009  . GERD 05/16/2009   Past Medical History  Diagnosis Date  . GERD (gastroesophageal reflux disease)   . Hyperlipidemia   . Hypertension   . Anxiety    Past Surgical History  Procedure Laterality Date  . Tubal ligation    . [place]  [place] ([place]    6 mm hyperplastic polyp, internal hemorrhoids  . Esophagogast[place]oscopy  [place]/2008 ([place]    reflux esophagitis, gastritis   History  Substance Use Topics  . Smoking status: Former Games developer  . Smokeless tobacco: Never Used     Comment: Quit 07/2012  . Alcohol Use: 1.0 oz/week    2 drink(s) per week   Family History  Problem Relation Age of Onset  . Heart disease Mother   .  Hypertension Mother   . Cancer Father     stomach  . Stomach cancer Father 47  . Colon cancer Neg Hx    No Known Allergies Current Outpatient Prescriptions on File Prior to Visit  Medication Sig Dispense Refill  . ALPRAZolam (XANAX) 0.25 MG tablet Take one tablet by mouth three times a day as needed for anxiety  90 tablet  0  . amLODipine-valsartan (EXFORGE) 10-160 MG per tablet TAKE 1 TABLET BY MOUTH DAILY.  30 tablet  3  . Blood Glucose Monitoring Suppl (ONE TOUCH BASIC SYSTEM) W/DEVICE KIT Use as directed.  1 each  0  . HYDROcodone-acetaminophen (VICODIN) 5-500 MG per tablet Take 1 tablet by mouth every 6 (six) hours as needed.        . metaxalone (SKELAXIN) 800 MG tablet daily as needed. Take one by mouth at bedtime      . metFORMIN (GLUCOPHAGE) 500 MG tablet TAKE 1 TABLET (500 MG TOTAL) BY MOUTH 2 (TWO) TIMES DAILY WITH A MEAL.  180 tablet  3  . pantoprazole (PROTONIX) 40 MG tablet Take 1 tablet (40 mg total) by mouth daily.  30 tablet  2  . zolpidem (AMBIEN CR) 6.25 MG CR tablet 1-2 tablets as needed for insomnia  60 tablet  0   No current facility-administered medications on file prior to visit.   The PMH, PSH, Social History, Family  History, Medications, and allergies have been reviewed in [facility], and have been updated if relevant.   Review of Systems See HPI     Objective:   Physical Exam BP 136/80  Pulse 101  Temp(Src) 98.3 F (36.8 C) (Oral)  Ht 5\' 7"  (1.702 m)  Wt 184 lb (83.462 kg)  BMI 28.81 kg/m2 Wt Readings from Last 3 Encounters:  04/11/13 184 lb (83.462 kg)  12/27/12 185 lb (83.915 kg)  09/29/12 185 lb (83.915 kg)     General:  Well-developed,well-nourished,in no acute distress; alert,appropriate and cooperative throughout examination Head:  normocephalic and atraumatic.   Eyes:  vision grossly intact, pupils equal, pupils round, and pupils reactive to light.   Ears:  R ear normal and L ear normal.   Nose:  no external deformity.   Mouth:  good  dentition.   Lungs:  Normal respiratory effort, chest expands symmetrically. Lungs are clear to auscultation, no crackles or wheezes. Heart:  Normal rate and regular rhythm. S1 and S2 normal without gallop, murmur, click, rub or other extra sounds. Msk:  No deformity or scoliosis noted of thoracic or lumbar spine.   Extremities:  No clubbing, cyanosis, edema, or deformity noted with normal full range of motion of all joints.   Neurologic:  alert & oriented X3 and gait normal.   Skin:  Intact without suspicious lesions or rashes Psych:  Cognition and judgment appear intact. Alert and cooperative with normal attention span and concentration. No apparent delusions, illusions, hallucinations  Diabetic foot exam: Normal inspection No skin breakdown No calluses  Normal DP pulses Normal sensation to light touch and monofilament Nails normal       Assessment & Plan:    1. HYPERLIPIDEMIA On Zocor. Check labs today. - Comprehensive metabolic panel - Lipid Panel  2. HYPERTENSION Stable.  Continue rx. - Comprehensive metabolic panel  3. Diabetes mellitus Recheck a1c today. - HM Diabetes Foot Exam - Hemoglobin A1c  4. TOBACCO ABUSE Start Zyban. Encouraged her to continue working on cessation.

## 2013-04-11 NOTE — Addendum Note (Signed)
Addended by: Janalyn Harder on: 04/11/2013 02:17 PM   Modules accepted: Orders

## 2013-04-12 ENCOUNTER — Encounter: Payer: Self-pay | Admitting: *Deleted

## 2013-05-10 ENCOUNTER — Other Ambulatory Visit: Payer: Self-pay | Admitting: *Deleted

## 2013-05-10 MED ORDER — PANTOPRAZOLE SODIUM 40 MG PO TBEC: 40.0000 mg | DELAYED_RELEASE_TABLET | Freq: Every day | ORAL | Status: DC

## 2013-05-10 NOTE — Telephone Encounter (Signed)
Received faxed refill request from pharmacy. Refill sent to pharmacy electronically. 

## 2013-06-06 ENCOUNTER — Other Ambulatory Visit: Payer: Self-pay | Admitting: Family Medicine

## 2013-06-06 NOTE — Telephone Encounter (Signed)
Fax refill request.

## 2013-06-07 MED ORDER — ALPRAZOLAM 0.25 MG PO TABS: ORAL_TABLET | ORAL | Status: DC

## 2013-06-07 MED ORDER — ZOLPIDEM TARTRATE ER 6.25 MG PO TBCR: EXTENDED_RELEASE_TABLET | ORAL | Status: DC

## 2013-06-07 NOTE — Telephone Encounter (Signed)
Ok to phone in.

## 2013-06-07 NOTE — Telephone Encounter (Signed)
Rx called in as prescribed 

## 2013-06-24 ENCOUNTER — Other Ambulatory Visit: Payer: Self-pay

## 2013-06-24 MED ORDER — VARENICLINE TARTRATE 1 MG PO TABS: 1.0000 mg | ORAL_TABLET | Freq: Two times a day (BID) | ORAL | Status: DC

## 2013-06-24 NOTE — Telephone Encounter (Signed)
Patient notified as instructed by telephone that [facility] was [facility].

## 2013-06-24 NOTE — Telephone Encounter (Signed)
Pt left v/m requesting refill on Chantix to [facility] pharmacy; pt request cb when refilled. Pt would like to have before weekend.

## 2013-07-05 ENCOUNTER — Other Ambulatory Visit: Payer: Self-pay | Admitting: *Deleted

## 2013-07-05 MED ORDER — SIMVASTATIN 40 MG PO TABS: ORAL_TABLET | ORAL | Status: DC

## 2013-07-27 ENCOUNTER — Other Ambulatory Visit: Payer: Self-pay | Admitting: *Deleted

## 2013-07-27 MED ORDER — AMLODIPINE BESYLATE-VALSARTAN 10-160 MG PO TABS: ORAL_TABLET | ORAL | Status: DC

## 2013-08-19 ENCOUNTER — Other Ambulatory Visit: Payer: Self-pay | Admitting: Internal Medicine

## 2013-08-19 NOTE — Telephone Encounter (Signed)
Spoke to pt and informed her Rx has been faxed to requested pharmacy 

## 2013-08-19 NOTE — Telephone Encounter (Signed)
Last filled 06/06/2013--please advise

## 2013-08-22 ENCOUNTER — Other Ambulatory Visit: Payer: Self-pay | Admitting: Family Medicine

## 2013-09-21 ENCOUNTER — Other Ambulatory Visit: Payer: Self-pay | Admitting: Internal Medicine

## 2013-09-21 NOTE — Telephone Encounter (Signed)
Spoke to pt and informed her Rx has been called in to requested pharmacy 

## 2013-09-21 NOTE — Telephone Encounter (Signed)
Last OV with you was 04/11/13 and Rx last prescribed 06/06/13 #90--please advise

## 2013-10-28 ENCOUNTER — Other Ambulatory Visit: Payer: Self-pay | Admitting: Family Medicine

## 2013-10-28 NOTE — Telephone Encounter (Signed)
Spoke to pt and scheduled f/u appt. Pt informed Rx called in to requested pharmacy

## 2013-10-28 NOTE — Telephone Encounter (Signed)
Ok to refill one time only. 

## 2013-10-28 NOTE — Telephone Encounter (Signed)
Pt requesting medication refill. Last ov 03/2013 with no future appts scheduled. pls advise. 

## 2013-11-09 ENCOUNTER — Ambulatory Visit (INDEPENDENT_AMBULATORY_CARE_PROVIDER_SITE_OTHER): Payer: BC Managed Care – PPO | Admitting: Family Medicine

## 2013-11-09 ENCOUNTER — Telehealth: Payer: Self-pay | Admitting: Family Medicine

## 2013-11-09 ENCOUNTER — Encounter: Payer: Self-pay | Admitting: Family Medicine

## 2013-11-09 VITALS — BP 142/78 | HR 86 | Temp 97.7°F | Ht 65.75 in | Wt 188.0 lb

## 2013-11-09 DIAGNOSIS — F17200 Nicotine dependence, unspecified, uncomplicated: Secondary | ICD-10-CM

## 2013-11-09 DIAGNOSIS — E119 Type 2 diabetes mellitus without complications: Secondary | ICD-10-CM

## 2013-11-09 DIAGNOSIS — E785 Hyperlipidemia, unspecified: Secondary | ICD-10-CM

## 2013-11-09 DIAGNOSIS — I10 Essential (primary) hypertension: Secondary | ICD-10-CM

## 2013-11-09 LAB — COMPREHENSIVE METABOLIC PANEL
ALT: 39 U/L — ABNORMAL HIGH (ref 0–35)
AST: 24 U/L (ref 0–37)
Albumin: 4.2 g/dL (ref 3.5–5.2)
Alkaline Phosphatase: 68 U/L (ref 39–117)
BILIRUBIN TOTAL: 0.5 mg/dL (ref 0.3–1.2)
BUN: 10 mg/dL (ref 6–23)
CHLORIDE: 104 meq/L (ref 96–112)
CO2: 26 mEq/L (ref 19–32)
CREATININE: 0.7 mg/dL (ref 0.4–1.2)
Calcium: 9.5 mg/dL (ref 8.4–10.5)
GFR: 91.46 mL/min (ref >60.00)
Glucose, Bld: 85 mg/dL (ref 70–99)
Potassium: 4.1 mEq/L (ref 3.5–5.1)
Sodium: 141 mEq/L (ref 135–145)
Total Protein: 7.7 g/dL (ref 6.0–8.3)

## 2013-11-09 LAB — MICROALBUMIN / CREATININE URINE RATIO
CREATININE, U: 29.5 mg/dL
Microalb Creat Ratio: 0.3 mg/g (ref 0.0–30.0)
Microalb, Ur: 0.1 mg/dL (ref 0.0–1.9)

## 2013-11-09 LAB — LIPID PANEL
CHOL/HDL RATIO: 3
CHOLESTEROL: 164 mg/dL (ref 0–200)
HDL: 51.9 mg/dL (ref >39.00)
LDL CALC: 82 mg/dL (ref 0–99)
Triglycerides: 150 mg/dL — ABNORMAL HIGH (ref 0.0–149.0)
VLDL: 30 mg/dL (ref 0.0–40.0)

## 2013-11-09 LAB — HEMOGLOBIN A1C: Hgb A1c MFr Bld: 6.2 % (ref 4.6–6.5)

## 2013-11-09 MED ORDER — ZOLPIDEM TARTRATE ER 6.25 MG PO TBCR: EXTENDED_RELEASE_TABLET | ORAL | Status: DC

## 2013-11-09 NOTE — Progress Notes (Signed)
Pre visit review using our clinic review tool, if applicable. No additional management support is needed unless otherwise documented below in the visit note. 

## 2013-11-09 NOTE — Progress Notes (Signed)
Subjective:    Patient ID: Connie Maldonado, female    DOB: Dec 31, 1955, 58 y.o.   MRN: [id number]  58 yo pleasant female here for follow up. Doing ok- unfortunately her dog is dying.  She feels she is coping the best she can.  HLD- on zocor 40 mg daily. Lab Results  Component Value Date   CHOL 174 04/11/2013   HDL 52.10 04/11/2013   LDLCALC 95 04/11/2013   TRIG 134.0 04/11/2013   CHOLHDL 3 04/11/2013    DM- Metformin 500 mg twice daily.  Exercising- zumba classes. Wt Readings from Last 3 Encounters:  11/09/13 188 lb (85.276 kg)  04/11/13 184 lb (83.462 kg)  12/27/12 185 lb (83.915 kg)     Checks CBGs occasionally (usually once a week)- Usually in 120s- 130s. Denies any episodes of hypoglycemia.  Denies any increased thirst or urination. Lab Results  Component Value Date   HGBA1C 6.6* 04/11/2013   Insomnia- Ambien Cr has helped with her night time awakenings but very expensive.  Does not take every night.  Tobacco abuse-  Restarted chantix.  Last smoked a month ago.  Zyban did not work.   HTN- stable.  Denies any CP, SOB, HA or blurred vision. Lab Results  Component Value Date   CREATININE 0.7 04/11/2013     Patient Active Problem List   Diagnosis Date Noted  . Internal hemorrhoids 12/27/2012  . Anal fissure 09/29/2012  . Pain in right ear 09/29/2012  . Insomnia 01/08/2012  . Diabetes mellitus 07/29/2011  . Hyperglycemia 04/17/2011  . Elevated liver function tests 04/17/2011  . SKIN RASH 09/16/2010  . TOBACCO ABUSE 04/16/2010  . ADJUSTMENT DISORDER WITH ANXIETY 10/23/2009  . HYPERLIPIDEMIA 05/16/2009  . HYPERTENSION 05/16/2009  . GERD 05/16/2009   Past Medical History  Diagnosis Date  . GERD (gastroesophageal reflux disease)   . Hyperlipidemia   . Hypertension   . Anxiety    Past Surgical History  Procedure Laterality Date  . Tubal ligation    . [place]  [place]    6 mm hyperplastic polyp, internal hemorrhoids  . Esophagogastroduodenoscopy   [place] ([place]    reflux esophagitis, gastritis   History  Substance Use Topics  . Smoking status: Former Research scientist (life sciences)  . Smokeless tobacco: Never Used     Comment: Quit 07/2012  . Alcohol Use: 1.0 oz/week    2 drink(s) per week   Family History  Problem Relation Age of Onset  . Heart disease Mother   . Hypertension Mother   . Cancer Father     stomach  . Stomach cancer Father 65  . Colon cancer Neg Hx    No Known Allergies Current Outpatient Prescriptions on File Prior to Visit  Medication Sig Dispense Refill  . ALPRAZolam (XANAX) 0.25 MG tablet TAKE ONE TABLET BY MOUTH 3 TIMES DAILY AS NEEDED FOR ANXIETY  90 tablet  0  . amLODipine-valsartan (EXFORGE) 10-160 MG per tablet TAKE 1 TABLET BY MOUTH DAILY.  30 tablet  3  . Blood Glucose Monitoring Suppl ([place]) W/DEVICE KIT Use as directed.  1 each  0  . buPROPion (ZYBAN) 150 MG 12 hr tablet Take 1 tablet (150 mg total) by mouth 2 (two) times daily.  60 tablet  3  . HYDROcodone-acetaminophen (VICODIN) 5-500 MG per tablet Take 1 tablet by mouth every 6 (six) hours as needed.        . hydrocortisone (ANUSOL-HC) 25 MG suppository Place 25 mg rectally 2 (two) times daily as  needed.      . metaxalone (SKELAXIN) 800 MG tablet daily as needed. Take one by mouth at bedtime      . metFORMIN (GLUCOPHAGE) 500 MG tablet TAKE ONE TABLET TWICE A DAY WITH A MEAL  180 tablet  1  . pantoprazole (PROTONIX) 40 MG tablet Take 1 tablet (40 mg total) by mouth daily.  30 tablet  5  . simvastatin (ZOCOR) 40 MG tablet TAKE 1 TABLET BY MOUTH ONCE A DAY.  30 tablet  5  . varenicline (CHANTIX CONTINUING MONTH PAK) 1 MG tablet Take 1 tablet (1 mg total) by mouth 2 (two) times daily.  60 tablet  3  . zolpidem (AMBIEN CR) 6.25 MG CR tablet TAKE 1 TO 2 TABLETS BY MOUTH AT BEDTIME AS NEEDED FOR INSOMNIA  60 tablet  0   No current facility-administered medications on file prior to visit.   The PMH, PSH, Social History, Family History, Medications, and  allergies have been reviewed in [facility], and have been updated if relevant.   Review of Systems See HPI     Objective:   Physical Exam BP 142/78  Pulse 86  Temp(Src) 97.7 F (36.5 C) (Oral)  Ht 5' 5.75" (1.67 m)  Wt 188 lb (85.276 kg)  BMI 30.58 kg/m2  SpO2 96% Wt Readings from Last 3 Encounters:  11/09/13 188 lb (85.276 kg)  04/11/13 184 lb (83.462 kg)  12/27/12 185 lb (83.915 kg)     General:  Well-developed,well-nourished,in no acute distress; alert,appropriate and cooperative throughout examination Head:  normocephalic and atraumatic.   Eyes:  vision grossly intact, pupils equal, pupils round, and pupils reactive to light.   Ears:  R ear normal and L ear normal.   Nose:  no external deformity.   Mouth:  good dentition.   Lungs:  Normal respiratory effort, chest expands symmetrically. Lungs are clear to auscultation, no crackles or wheezes. Heart:  Normal rate and regular rhythm. S1 and S2 normal without gallop, murmur, click, rub or other extra sounds. Msk:  No deformity or scoliosis noted of thoracic or lumbar spine.   Extremities:  No clubbing, cyanosis, edema, or deformity noted with normal full range of motion of all joints.   Neurologic:  alert & oriented X3 and gait normal.   Skin:  Intact without suspicious lesions or rashes Psych:  Cognition and judgment appear intact. Alert and cooperative with normal attention span and concentration. No apparent delusions, illusions, hallucinations       Assessment & Plan:

## 2013-11-09 NOTE — Patient Instructions (Signed)
Great to see you. I'm so sorry about Melody.  I will call you with your lab results.

## 2013-11-09 NOTE — Assessment & Plan Note (Signed)
Improved with Chantix!

## 2013-11-09 NOTE — Assessment & Plan Note (Signed)
Due for labs today. 

## 2013-11-09 NOTE — Telephone Encounter (Signed)
Relevant patient education assigned to patient using Emmi. ° °

## 2013-11-09 NOTE — Assessment & Plan Note (Signed)
Stable on current rx. Check labs today. 

## 2013-11-09 NOTE — Assessment & Plan Note (Signed)
Well controlled. No changes. 

## 2013-11-10 ENCOUNTER — Encounter: Payer: Self-pay | Admitting: *Deleted

## 2013-11-11 ENCOUNTER — Other Ambulatory Visit: Payer: Self-pay | Admitting: Family Medicine

## 2013-11-11 NOTE — Telephone Encounter (Signed)
Last office visit 11/09/2013.  Ok to refill? 

## 2013-11-18 ENCOUNTER — Telehealth: Payer: Self-pay

## 2013-11-18 NOTE — Telephone Encounter (Signed)
Relevant patient education assigned to patient using Emmi. ° °

## 2013-11-22 ENCOUNTER — Other Ambulatory Visit: Payer: Self-pay | Admitting: Family Medicine

## 2013-12-14 ENCOUNTER — Encounter: Payer: Self-pay | Admitting: Family Medicine

## 2013-12-15 ENCOUNTER — Other Ambulatory Visit: Payer: Self-pay | Admitting: Family Medicine

## 2013-12-15 NOTE — Telephone Encounter (Signed)
Last office visit 11/09/2013.  Ok to refill? 

## 2013-12-21 ENCOUNTER — Other Ambulatory Visit: Payer: Self-pay | Admitting: Family Medicine

## 2013-12-23 NOTE — Telephone Encounter (Signed)
Pt requesting medication refill. Last f/u appt 10/2013 with no future appts scheduled. pls advise

## 2013-12-23 NOTE — Telephone Encounter (Signed)
Spoke to pt and informed her Rx has been faxed to requested pharmacy 

## 2014-01-12 ENCOUNTER — Other Ambulatory Visit: Payer: Self-pay | Admitting: Family Medicine

## 2014-01-12 NOTE — Telephone Encounter (Signed)
Last office visit 11/09/2013.  Ok to refill?

## 2014-02-25 ENCOUNTER — Other Ambulatory Visit: Payer: Self-pay | Admitting: Family Medicine

## 2014-03-06 ENCOUNTER — Other Ambulatory Visit: Payer: Self-pay

## 2014-03-06 MED ORDER — ALPRAZOLAM 0.25 MG PO TABS: ORAL_TABLET | ORAL | Status: DC

## 2014-03-06 NOTE — Telephone Encounter (Signed)
Pt left v/m requesting refill [facility]. Pt request cb when refilled.

## 2014-03-06 NOTE — Telephone Encounter (Signed)
Spoke to pt and informed her Rx has been called in to requested pharmacy 

## 2014-03-08 ENCOUNTER — Other Ambulatory Visit: Payer: Self-pay | Admitting: Family Medicine

## 2014-05-01 ENCOUNTER — Other Ambulatory Visit: Payer: Self-pay | Admitting: Family Medicine

## 2014-07-18 ENCOUNTER — Other Ambulatory Visit: Payer: Self-pay | Admitting: Family Medicine

## 2014-07-18 NOTE — Telephone Encounter (Signed)
Pt requesting medication refill. Last f/u appt and med refill 10/2013. Pt has not been seen since that time. pls advise

## 2014-07-20 NOTE — Telephone Encounter (Signed)
Rx called in to requested pharmacy 

## 2014-08-18 ENCOUNTER — Ambulatory Visit: Payer: Self-pay | Admitting: Gastroenterology

## 2014-11-13 LAB — SURGICAL PATHOLOGY

## 2015-05-29 ENCOUNTER — Other Ambulatory Visit: Payer: Self-pay | Admitting: *Deleted

## 2015-05-29 ENCOUNTER — Inpatient Hospital Stay
Admission: RE | Admit: 2015-05-29 | Discharge: 2015-05-29 | Disposition: A | Discharge status: Home / Self Care | Payer: Self-pay | Source: Ambulatory Visit | Attending: *Deleted | Admitting: *Deleted

## 2015-05-29 ENCOUNTER — Other Ambulatory Visit: Payer: Self-pay | Admitting: Obstetrics and Gynecology

## 2015-05-29 DIAGNOSIS — Z9289 Personal history of other medical treatment: Secondary | ICD-10-CM

## 2015-05-29 DIAGNOSIS — R928 Other abnormal and inconclusive findings on diagnostic imaging of breast: Secondary | ICD-10-CM

## 2015-06-19 ENCOUNTER — Ambulatory Visit
Admission: RE | Admit: 2015-06-19 | Discharge: 2015-06-19 | Disposition: A | Discharge status: Home / Self Care | Payer: BLUE CROSS/BLUE SHIELD | Source: Ambulatory Visit | Attending: Obstetrics and Gynecology | Admitting: Obstetrics and Gynecology

## 2015-06-19 ENCOUNTER — Other Ambulatory Visit: Payer: Self-pay | Admitting: Obstetrics and Gynecology

## 2015-06-19 DIAGNOSIS — N63 Unspecified lump in breast: Secondary | ICD-10-CM | POA: Insufficient documentation

## 2015-06-19 DIAGNOSIS — R928 Other abnormal and inconclusive findings on diagnostic imaging of breast: Secondary | ICD-10-CM | POA: Diagnosis present

## 2015-06-20 ENCOUNTER — Other Ambulatory Visit: Payer: Self-pay | Admitting: Obstetrics and Gynecology

## 2015-06-20 DIAGNOSIS — N6320 Unspecified lump in the left breast, unspecified quadrant: Secondary | ICD-10-CM

## 2015-06-21 HISTORY — PX: BREAST BIOPSY: SHX20

## 2015-07-03 ENCOUNTER — Ambulatory Visit
Admission: RE | Admit: 2015-07-03 | Discharge: 2015-07-03 | Disposition: A | Discharge status: Home / Self Care | Payer: BLUE CROSS/BLUE SHIELD | Source: Ambulatory Visit | Attending: Obstetrics and Gynecology | Admitting: Obstetrics and Gynecology

## 2015-07-03 DIAGNOSIS — C50412 Malignant neoplasm of upper-outer quadrant of left female breast: Secondary | ICD-10-CM | POA: Diagnosis not present

## 2015-07-03 DIAGNOSIS — N6320 Unspecified lump in the left breast, unspecified quadrant: Secondary | ICD-10-CM

## 2015-07-03 DIAGNOSIS — N63 Unspecified lump in breast: Secondary | ICD-10-CM | POA: Diagnosis present

## 2015-07-05 NOTE — Progress Notes (Signed)
Preparing for Breast Case Conference.  Phoned Fraser Din PA at [facility]. She prefers to contact patient with diagnosis, and make referral to surgeon. Received patient history from her. States she will call back to notify of referral. Explained may hold for next case conference after referral is made.

## 2015-07-06 LAB — SURGICAL PATHOLOGY

## 2015-07-10 NOTE — Progress Notes (Signed)
  Oncology Nurse Navigator Documentation    Navigator Encounter Type: Introductory phone call (07/10/15 1500) Patient Visit Type: Initial (07/10/15 1500)                    Time Spent with Patient: 30 (07/10/15 1500)   Phoned patient to introduce navigation service.  Collected family history/highrisk information for case conference. Will [facility] Breast Cancer Treatment Handbook/folderwith hospital services to Dr. Dwyane Luo office. Patient has consult scheduled with Dr. Bary Castilla on 07/12/15 at 4:45.

## 2015-07-12 ENCOUNTER — Encounter: Payer: Self-pay | Admitting: General Surgery

## 2015-07-12 ENCOUNTER — Other Ambulatory Visit: Payer: BLUE CROSS/BLUE SHIELD

## 2015-07-12 ENCOUNTER — Ambulatory Visit (INDEPENDENT_AMBULATORY_CARE_PROVIDER_SITE_OTHER): Payer: BLUE CROSS/BLUE SHIELD | Admitting: General Surgery

## 2015-07-12 VITALS — BP 144/70 | HR 82 | Resp 15 | Ht 67.0 in | Wt 207.0 lb

## 2015-07-12 DIAGNOSIS — C50412 Malignant neoplasm of upper-outer quadrant of left female breast: Secondary | ICD-10-CM | POA: Diagnosis not present

## 2015-07-12 DIAGNOSIS — C50912 Malignant neoplasm of unspecified site of left female breast: Secondary | ICD-10-CM

## 2015-07-12 NOTE — Progress Notes (Signed)
Patient ID: Connie Maldonado, female   DOB: 05-17-1956, 59 y.o.   MRN: [id number]  Chief Complaint  Patient presents with  . Breast Cancer    HPI Connie Maldonado is a 59 y.o. female who is seen today to discuss options for management of her recently diagnosed breast cancer. The most recent yearly mammogram was done on 05/29/15 at [facility] and 07/03/15 at [facility] and left breast biopsy done on 07/09/15. Patient does perform regular self breast checks and gets regular mammograms done. She denies any breast pain and did not feel any breast mass.  She is accompanied today by my old neighbor, Connie Maldonado.   I personally reviewed the patient's medical history.      HPI  Past Medical History  Diagnosis Date  . GERD (gastroesophageal reflux disease)   . Hyperlipidemia   . Hypertension   . Anxiety   . Diabetes mellitus without complication [facility])     Past Surgical History  Procedure Laterality Date  . Tubal ligation    . [place]  [place] ([place]    6 mm hyperplastic polyp, internal hemorrhoids  . Esophagogastroduodenoscopy  [place]/2008 ([place]    reflux esophagitis, gastritis    Family History  Problem Relation Age of Onset  . Heart disease Mother   . Hypertension Mother   . Cancer Father     stomach  . Stomach cancer Father 37  . Colon cancer Father     Social History Social History  Substance Use Topics  . Smoking status: Former Research scientist (life sciences)  . Smokeless tobacco: Never Used     Comment: Quit 07/2012  . Alcohol Use: 1.0 oz/week    2 drink(s) per week    No Known Allergies  Current Outpatient Prescriptions  Medication Sig Dispense Refill  . ALPRAZolam (XANAX) 0.25 MG tablet TAKE ONE TABLET BY MOUTH 3 TIMES DAILY AS NEEDED 90 tablet 0  . amLODipine (NORVASC) 10 MG tablet Take 10 mg by mouth daily.    Marland Kitchen BLACK COHOSH PO Take by mouth.    . Calcium Carb-Cholecalciferol (CALCIUM 600 + D PO) Take 1 tablet by mouth daily.    . CHANTIX 1 MG tablet TAKE ONE TABLET TWICE DAILY 56 tablet 3   . HYDROcodone-acetaminophen (VICODIN) 5-500 MG per tablet Take 1 tablet by mouth every 6 (six) hours as needed.      . hydrocortisone (ANUSOL-HC) 25 MG suppository Place 25 mg rectally 2 (two) times daily as needed.    . metaxalone (SKELAXIN) 800 MG tablet daily as needed. Take one by mouth at bedtime    . metFORMIN (GLUCOPHAGE) 500 MG tablet TAKE ONE TABLET TWICE DAILY WITH MEALS 180 tablet 0  . Multiple Vitamin (MULTIVITAMIN) tablet Take 1 tablet by mouth daily.    . pantoprazole (PROTONIX) 40 MG tablet TAKE ONE TABLET BY MOUTH EVERY DAY 30 tablet 1  . simvastatin (ZOCOR) 40 MG tablet TAKE 1 TABLET BY MOUTH ONCE A DAY 30 tablet 0  . valsartan (DIOVAN) 160 MG tablet Take 160 mg by mouth daily.    . vitamin C (ASCORBIC ACID) 500 MG tablet Take 500 mg by mouth daily.    . Zinc 50 MG TABS Take 1 tablet by mouth daily.    Marland Kitchen zolpidem (AMBIEN CR) 6.25 MG CR tablet TAKE 1 TO 2 TABLETS AT BEDTIME AS NEEDED 60 tablet 0   No current facility-administered medications for this visit.    Review of Systems Review of Systems  Constitutional: Negative.   Respiratory: Negative.   Cardiovascular:  Negative.     Blood pressure 144/70, pulse 82, resp. rate 15, height '5\' 7"'$  (1.702 m), weight 207 lb (93.895 kg).  Physical Exam Physical Exam  Constitutional: She is oriented to person, place, and time. She appears well-developed and well-nourished.  HENT:  Mouth/Throat: Oropharynx is clear and moist.  Eyes: Conjunctivae are normal. No scleral icterus.  Neck: Neck supple.  Cardiovascular: Normal rate, regular rhythm and normal heart sounds.   Pulmonary/Chest: Effort normal and breath sounds normal. Right breast exhibits no inverted nipple, no mass, no nipple discharge, no skin change and no tenderness. Left breast exhibits no inverted nipple, no mass, no nipple discharge, no skin change and no tenderness.    Lymphadenopathy:    She has no cervical adenopathy.    She has no axillary adenopathy.   Neurological: She is alert and oriented to person, place, and time.  Skin: Skin is warm and dry.  Psychiatric: Her behavior is normal.    Data Reviewed PCP notes of Ardeth Perfect, PA dated 05/23/2015 were reviewed. Most recent DEXA scan for bone density was in 2012. Last completed in 2016. First-degree relative with colon cancer, repeat exam in 5 years.  Focal spot compression views of the left breast dated 06/09/2015 as well as an ultrasound of the same date showed an 8 mm mass at 3:00 position, 6 admission the nipple.  Subsequent biopsy completed 07/03/2015 showed an invasive mammary carcinoma, 2 mm on biopsy sample. Histologic grade 1. ER 90%, PR 90%, HER-2/neu not amplified.  Ultrasound examination was undertaken to confirm the area will not require wire localization for excision. In the 6:00 position of the left breast, 6 cm from the nipple a well-defined hypoechoic nodule with posterior acoustic shadowing measuring 0.5 x 0.55 x 0.67 cm is identified. BI-RADS-6..  Assessment    Stage I carcinoma the left breast.    Plan    The better part of 45 minutes was spent reviewing options for management. Breast conservation and mastectomy were presented as equivalent procedures. Pros and cons of each were reviewed. Importance of post wide excision radiation therapy discussed.  Informational brochure provided.  The patient will consider her options and notify the office of how she would like to proceed.    PCP:  Ouida Sills This information has been scribed by Gaspar Cola CMA.    Robert Bellow 07/13/2015, 1:30 PM

## 2015-07-13 DIAGNOSIS — C50412 Malignant neoplasm of upper-outer quadrant of left female breast: Secondary | ICD-10-CM | POA: Insufficient documentation

## 2015-07-22 HISTORY — PX: BREAST LUMPECTOMY: SHX2

## 2015-07-27 ENCOUNTER — Encounter: Payer: Self-pay | Admitting: Radiology

## 2015-08-13 ENCOUNTER — Telehealth: Payer: Self-pay | Admitting: General Surgery

## 2015-08-13 NOTE — Telephone Encounter (Signed)
The patient had been seen at the end of December regarding a recently identified breast cancer. We had not heard back, and I spoke with her today. She had gone through the holidays and celebrated her granddaughter's birthday, and is now battling a sinus infection. She is looking towards breast conservation, and reports that she will contact the office when she has some dates firmed up.  She was advised that the operating room runs out first,, first come, first serve basis and that she'll most likely get the time and date she wants if she notify us earlier rather than later as to her preferences.

## 2015-08-14 ENCOUNTER — Telehealth: Payer: Self-pay | Admitting: *Deleted

## 2015-08-14 NOTE — Telephone Encounter (Signed)
error 

## 2015-08-22 ENCOUNTER — Other Ambulatory Visit: Payer: Self-pay | Admitting: *Deleted

## 2015-08-22 DIAGNOSIS — C50412 Malignant neoplasm of upper-outer quadrant of left female breast: Secondary | ICD-10-CM

## 2015-08-22 NOTE — Progress Notes (Signed)
Patient's surgery has been scheduled for 09-19-15 at [facility].

## 2015-09-10 ENCOUNTER — Encounter: Payer: Self-pay | Admitting: General Surgery

## 2015-09-10 ENCOUNTER — Ambulatory Visit (INDEPENDENT_AMBULATORY_CARE_PROVIDER_SITE_OTHER): Payer: BLUE CROSS/BLUE SHIELD | Admitting: General Surgery

## 2015-09-10 ENCOUNTER — Other Ambulatory Visit: Payer: Self-pay

## 2015-09-10 VITALS — BP 138/76 | HR 80 | Resp 16 | Ht 67.0 in | Wt 215.0 lb

## 2015-09-10 DIAGNOSIS — C50412 Malignant neoplasm of upper-outer quadrant of left female breast: Secondary | ICD-10-CM

## 2015-09-10 NOTE — H&P (Signed)
Connie Maldonado is a 60 y.o. female here today for her pre op left breast wide excision scheduled on 09/19/15. The patient has recovered from her sinus infection earlier that she her period presently feeling well. Unaccompanied today.  I personally reviewed her history.  HPI  Past Medical History   Diagnosis  Date   .  GERD (gastroesophageal reflux disease)    .  Hyperlipidemia    .  Hypertension    .  Anxiety    .  Diabetes mellitus without complication [facility])     Past Surgical History   Procedure  Laterality  Date   .  Tubal ligation     .  [place]   [place]04/[place] ([place]     6 mm hyperplastic polyp, internal hemorrhoids   .  Esophagogastroduodenoscopy   06/12/2008 ([place]     reflux esophagitis, gastritis    Family History   Problem  Relation  Age of Onset   .  Heart disease  Mother    .  Hypertension  Mother    .  Cancer  Father      stomach   .  Stomach cancer  Father  44   .  Colon cancer  Father     Social History  Social History   Substance Use Topics   .  Smoking status:  Former Research scientist (life sciences)   .  Smokeless tobacco:  Never Used      Comment: Quit 07/2012   .  Alcohol Use:  1.0 oz/week     2 drink(s) per week    No Known Allergies  Current Outpatient Prescriptions   Medication  Sig  Dispense  Refill   .  ALPRAZolam (XANAX) 0.25 MG tablet  TAKE ONE TABLET BY MOUTH 3 TIMES DAILY AS NEEDED (Patient taking differently: as needed. TAKE ONE TABLET BY MOUTH 3 TIMES DAILY AS NEEDED)  90 tablet  0   .  amLODipine (NORVASC) 10 MG tablet  Take 10 mg by mouth daily.     .  Calcium Carb-Cholecalciferol (CALCIUM 600 + D PO)  Take 1 tablet by mouth daily.     .  CHANTIX 1 MG tablet  TAKE ONE TABLET TWICE DAILY  56 tablet  3   .  HYDROcodone-acetaminophen (VICODIN) 5-500 MG per tablet  Take 1 tablet by mouth every 6 (six) hours as needed.     .  metaxalone (SKELAXIN) 800 MG tablet  daily as needed. Take one by mouth at bedtime     .  metFORMIN (GLUCOPHAGE) 500 MG tablet  TAKE ONE TABLET TWICE  DAILY WITH MEALS  180 tablet  0   .  Multiple Vitamin (MULTIVITAMIN) tablet  Take 1 tablet by mouth daily.     .  pantoprazole (PROTONIX) 40 MG tablet  TAKE ONE TABLET BY MOUTH EVERY DAY  30 tablet  1   .  simvastatin (ZOCOR) 40 MG tablet  TAKE 1 TABLET BY MOUTH ONCE A DAY (Patient taking differently: TAKE 0.5 TABLET BY MOUTH ONCE A DAY)  30 tablet  0   .  valsartan (DIOVAN) 160 MG tablet  Take 160 mg by mouth daily.     .  vitamin C (ASCORBIC ACID) 500 MG tablet  Take 500 mg by mouth daily.     .  Zinc 50 MG TABS  Take 1 tablet by mouth daily.     Marland Kitchen  zolpidem (AMBIEN CR) 6.25 MG CR tablet  TAKE 1 TO 2 TABLETS AT BEDTIME AS NEEDED  60 tablet  0    No current facility-administered medications for this visit.    Review of Systems  Review of Systems  Constitutional: Negative.  Respiratory: Negative.  Cardiovascular: Negative.   Blood pressure 138/76, pulse 80, resp. rate 16, height '5\' 7"'$  (1.702 m), weight 215 lb (97.523 kg).  Physical Exam  Physical Exam  Constitutional: She is oriented to person, place, and time. She appears well-developed and well-nourished.  Eyes: Conjunctivae are normal. No scleral icterus.  Neck: Neck supple.  Cardiovascular: Normal rate, regular rhythm and normal heart sounds.  Pulmonary/Chest: Effort normal and breath sounds normal.  Lymphadenopathy:  She has no cervical adenopathy.  Neurological: She is alert and oriented to person, place, and time.  Skin: Skin is warm and dry.   Data Reviewed  Pathology from her core biopsy showed a 2 mm invasive ductal carcinoma, ER positive, PR positive, HER-2/neu not overexpressed.  Ultrasound examination was completed to confirm that the patient would not require needle localization. At the 3:00 position of the left breast, 6 cm from the nipple a 0.35 x 0.44 x 0.56 cm hypoechoic area with dense acoustic shadowing is identified corresponding to the previous biopsy site. BI-RADS-6.  Assessment   Stage 1 carcinoma the left  breast.   Plan   Plans for breast conservation and sentinel node biopsy were reviewed.  Patient's surgery has been scheduled for 09-19-15 at [facility].   PCP: Ouida Sills  This information has been scribed by Gaspar Cola CMA.

## 2015-09-10 NOTE — Patient Instructions (Signed)
Patient's surgery has been scheduled for 09-19-15 at [facility].

## 2015-09-10 NOTE — Progress Notes (Signed)
Patient ID: Connie Maldonado, female   DOB: [birth year], 60 y.o.   MRN: [id number]  Chief Complaint  Patient presents with  . Pre-op Exam    left breast wide excision     HPI Connie Maldonado is a 60 y.o. female here today for her pre op left breast wide excision scheduled on 09/19/15. The patient has recovered from her sinus infection earlier that she her period presently feeling well. Unaccompanied today.  I personally reviewed her history. HPI  Past Medical History  Diagnosis Date  . GERD (gastroesophageal reflux disease)   . Hyperlipidemia   . Hypertension   . Anxiety   . Diabetes mellitus without complication [facility])     Past Surgical History  Procedure Laterality Date  . Tubal ligation    . [place]  [place] ([place]    6 mm hyperplastic polyp, internal hemorrhoids  . Esophagogast[place]oscopy  [place]/2008 ([place]    reflux esophagitis, gastritis    Family History  Problem Relation Age of Onset  . Heart disease Mother   . Hypertension Mother   . Cancer Father     stomach  . Stomach cancer Father 105  . Colon cancer Father     Social History Social History  Substance Use Topics  . Smoking status: Former Research scientist (life sciences)  . Smokeless tobacco: Never Used     Comment: Quit 07/2012  . Alcohol Use: 1.0 oz/week    2 drink(s) per week    No Known Allergies  Current Outpatient Prescriptions  Medication Sig Dispense Refill  . ALPRAZolam (XANAX) 0.25 MG tablet TAKE ONE TABLET BY MOUTH 3 TIMES DAILY AS NEEDED (Patient taking differently: as needed. TAKE ONE TABLET BY MOUTH 3 TIMES DAILY AS NEEDED) 90 tablet 0  . amLODipine (NORVASC) 10 MG tablet Take 10 mg by mouth daily.    . Calcium Carb-Cholecalciferol (CALCIUM 600 + D PO) Take 1 tablet by mouth daily.    . CHANTIX 1 MG tablet TAKE ONE TABLET TWICE DAILY 56 tablet 3  . HYDROcodone-acetaminophen (VICODIN) 5-500 MG per tablet Take 1 tablet by mouth every 6 (six) hours as needed.      . metaxalone (SKELAXIN) 800 MG tablet daily as  needed. Take one by mouth at bedtime    . metFORMIN (GLUCOPHAGE) 500 MG tablet TAKE ONE TABLET TWICE DAILY WITH MEALS 180 tablet 0  . Multiple Vitamin (MULTIVITAMIN) tablet Take 1 tablet by mouth daily.    . pantoprazole (PROTONIX) 40 MG tablet TAKE ONE TABLET BY MOUTH EVERY DAY 30 tablet 1  . simvastatin (ZOCOR) 40 MG tablet TAKE 1 TABLET BY MOUTH ONCE A DAY (Patient taking differently: TAKE 0.5 TABLET BY MOUTH ONCE A DAY) 30 tablet 0  . valsartan (DIOVAN) 160 MG tablet Take 160 mg by mouth daily.    . vitamin C (ASCORBIC ACID) 500 MG tablet Take 500 mg by mouth daily.    . Zinc 50 MG TABS Take 1 tablet by mouth daily.    Marland Kitchen zolpidem (AMBIEN CR) 6.25 MG CR tablet TAKE 1 TO 2 TABLETS AT BEDTIME AS NEEDED 60 tablet 0   No current facility-administered medications for this visit.    Review of Systems Review of Systems  Constitutional: Negative.   Respiratory: Negative.   Cardiovascular: Negative.     Blood pressure 138/76, pulse 80, resp. rate 16, height '5\' 7"'$  (1.702 m), weight 215 lb (97.523 kg).  Physical Exam Physical Exam  Constitutional: She is oriented to person, place, and time. She appears well-developed and well-nourished.  Eyes: Conjunctivae are normal. No scleral icterus.  Neck: Neck supple.  Cardiovascular: Normal rate, regular rhythm and normal heart sounds.   Pulmonary/Chest: Effort normal and breath sounds normal.  Lymphadenopathy:    She has no cervical adenopathy.  Neurological: She is alert and oriented to person, place, and time.  Skin: Skin is warm and dry.    Data Reviewed Pathology from her core biopsy showed a 2 mm invasive ductal carcinoma, ER positive, PR positive, HER-2/neu not overexpressed.  Ultrasound examination was  completed to confirm that the patient would not require needle localization. At the 3:00 position of the left breast, 6 cm from the nipple a 0.35 x 0.44 x 0.56 cm hypoechoic area with dense acoustic shadowing is identified corresponding to  the previous biopsy site. BI-RADS-6.  Assessment    Stage 1 carcinoma the left breast.    Plan    Plans for breast conservation and sentinel node biopsy were reviewed.  Patient's surgery has been scheduled for 09-19-15 at [facility].     PCP:  Ouida Sills This information has been scribed by Gaspar Cola CMA.   Robert Bellow 09/10/2015, 4:27 PM

## 2015-09-11 ENCOUNTER — Other Ambulatory Visit: Payer: BLUE CROSS/BLUE SHIELD

## 2015-09-11 ENCOUNTER — Encounter: Payer: Self-pay | Admitting: *Deleted

## 2015-09-11 DIAGNOSIS — I10 Essential (primary) hypertension: Secondary | ICD-10-CM

## 2015-09-11 NOTE — Patient Instructions (Signed)
  Your procedure is scheduled on: 09-19-15 Report to [place] (2ND DESK ON RIGHT)   Remember: Instructions that are not followed completely may result in serious medical risk, up to and including death, or upon the discretion of your surgeon and anesthesiologist your surgery may need to be rescheduled.    _X___ 1. Do not eat food or drink liquids after midnight. No gum chewing or hard candies.     _X___ 2. No Alcohol for 24 hours before or after surgery.   ____ 3. Bring all medications with you on the day of surgery if instructed.    _X___ 4. Notify your doctor if there is any change in your medical condition     (cold, fever, infections).     Do not wear jewelry, make-up, hairpins, clips or nail polish.  Do not wear lotions, powders, or perfumes. You may wear deodorant.  Do not shave 48 hours prior to surgery. Men may shave face and neck.  Do not bring valuables to the hospital.    [facility] is not responsible for any belongings or valuables.               Contacts, dentures or bridgework may not be worn into surgery.  Leave your suitcase in the car. After surgery it may be brought to your room.  For patients admitted to the hospital, discharge time is determined by your treatment team.   Patients discharged the day of surgery will not be allowed to drive home.   Please read over the following fact sheets that you were given:    _X___ Take these medicines the morning of surgery with A SIP OF WATER:    1. AMLODIPINE (NORVASC)  2.  DIOVAN (VALSARTAN)  3. LIPITOR (ATORVASTATIN)  4. PROTONIX (PANTOPRAZOLE)  5.  6.  ____ Fleet Enema (as directed)   _X___ Use CHG Soap as directed  ____ Use inhalers on the day of surgery  _X___ Stop metformin 2 days prior to surgery-LAST DOSE ON Sunday (09-16-15)    ____ Take 1/2 of usual insulin dose the night before surgery and none on the morning of surgery.   ____ Stop Coumadin/Plavix/aspirin-N/A  ____ Stop  Anti-inflammatories   _X___ Stop supplements until after surgery-STOP VITAMIN C NOW  ____ Bring C-Pap to the hospital.

## 2015-09-12 ENCOUNTER — Encounter
Admission: RE | Admit: 2015-09-12 | Discharge: 2015-09-12 | Disposition: A | Discharge status: Home / Self Care | Payer: BLUE CROSS/BLUE SHIELD | Source: Ambulatory Visit | Attending: General Surgery | Admitting: General Surgery

## 2015-09-12 DIAGNOSIS — Z01810 Encounter for preprocedural cardiovascular examination: Secondary | ICD-10-CM | POA: Diagnosis present

## 2015-09-12 DIAGNOSIS — Z01812 Encounter for preprocedural laboratory examination: Secondary | ICD-10-CM | POA: Diagnosis not present

## 2015-09-12 LAB — BASIC METABOLIC PANEL
ANION GAP: 13 (ref 5–15)
BUN: 8 mg/dL (ref 6–20)
CO2: 25 mmol/L (ref 22–32)
Calcium: 8.8 mg/dL — ABNORMAL LOW (ref 8.9–10.3)
Chloride: 101 mmol/L (ref 101–111)
Creatinine, Ser: 0.58 mg/dL (ref 0.44–1.00)
GFR calc Af Amer: >60 mL/min (ref >60)
GLUCOSE: 243 mg/dL — AB (ref 65–99)
POTASSIUM: 3.7 mmol/L (ref 3.5–5.1)
Sodium: 139 mmol/L (ref 135–145)

## 2015-09-12 NOTE — Pre-Procedure Instructions (Addendum)
PT STATED WHEN SHE CAME IN TO PAT FOR HER LABS AND EKG THAT SHE WAS GOING TO HAVE 1/2 CUP  TEA BEFORE HER SURGERY BECAUSE DR BYRNETT TOLD HER THAT SHE COULD AS LONG AS IT WAS 3 HOURS PRIOR TO SURGERY- I INSTRUCTED PT THAT ANESTHESIA'S PROTOCOL IS THAT CLEAR LIQUIDS HAVE TO BE IN WITHIN 6 HOURS OF SURGERY AND SHE CAN ONLY HAVE CLEAR LIQUIDS (BLACK COFFEE, APPLE JUICE, WATER, GATORADE ETC)  PT VERBALIZED UNDERSTANDING AND I TOLD HER THAT SHE COULD ONLY HAVE 4 OZ OF CLEAR LIQUIDS. PT VERBALIZED SHE UNDERSTOOD THIS ALSO.  I TOLD HER THAT WE REALLY DO ENCOURAGE NPO STATUS AFTER MN BUT THAT IF HER SURGEON SAID SHE COULD HAVE LIQUIDS SHE WILL HAVE TO FOLLOW ANESTHESIA'S GUIDELINES OR SHE COULD BE CANCELLED

## 2015-09-17 ENCOUNTER — Telehealth: Payer: Self-pay | Admitting: *Deleted

## 2015-09-17 NOTE — Telephone Encounter (Signed)
Patient called the office wanting to know if she could take a Xanax the morning of surgery since her arrival time is not until 11:15 am. This patient states she has a prescription for Xanax 0.25 mg at home.   Also, patient states that she has seen Dr. Arnette Norris in the past but she called to make an appointment to re-establish primary care as her current PCP is not in network with her insurance. Patient was told they were not accepting new patients at this time. This patient was told by Dr. Bary Castilla to call our office if she had problems getting in for an appointment with Dr. Deborra Medina and he would make a phone call.

## 2015-09-17 NOTE — Telephone Encounter (Signed)
Patient contacted and notified that she will need to sign a surgical consent form ahead of time if she wishes to take Xanax the day of surgery. This patient was given the number to call Nira Conn in Pre-admit (Phone: [phone]) or the main Pre-Admission Department (Phone: [phone]) to see what she needs to do about signing consent form ahead of time.   This patient was instructed that she could take Xanax 0.25 mg one hour prior to arrival at [facility].  She verbalizes understanding.

## 2015-09-17 NOTE — Telephone Encounter (Signed)
Surgery scheduled for Wednesday, 09-19-15.

## 2015-09-19 ENCOUNTER — Ambulatory Visit
Admission: RE | Admit: 2015-09-19 | Discharge: 2015-09-19 | Disposition: A | Discharge status: Home / Self Care | Payer: BLUE CROSS/BLUE SHIELD | Source: Ambulatory Visit | Attending: General Surgery | Admitting: General Surgery

## 2015-09-19 ENCOUNTER — Encounter
Admission: RE | Disposition: A | Discharge status: Home / Self Care | Payer: Self-pay | Source: Ambulatory Visit | Attending: General Surgery

## 2015-09-19 ENCOUNTER — Encounter: Payer: Self-pay | Admitting: Anesthesiology

## 2015-09-19 ENCOUNTER — Ambulatory Visit: Payer: BLUE CROSS/BLUE SHIELD

## 2015-09-19 ENCOUNTER — Ambulatory Visit: Payer: BLUE CROSS/BLUE SHIELD | Admitting: Anesthesiology

## 2015-09-19 DIAGNOSIS — Z7984 Long term (current) use of oral hypoglycemic drugs: Secondary | ICD-10-CM | POA: Diagnosis not present

## 2015-09-19 DIAGNOSIS — F419 Anxiety disorder, unspecified: Secondary | ICD-10-CM | POA: Diagnosis not present

## 2015-09-19 DIAGNOSIS — N6092 Unspecified benign mammary dysplasia of left breast: Secondary | ICD-10-CM | POA: Diagnosis not present

## 2015-09-19 DIAGNOSIS — E119 Type 2 diabetes mellitus without complications: Secondary | ICD-10-CM | POA: Insufficient documentation

## 2015-09-19 DIAGNOSIS — C50412 Malignant neoplasm of upper-outer quadrant of left female breast: Secondary | ICD-10-CM | POA: Diagnosis not present

## 2015-09-19 DIAGNOSIS — E785 Hyperlipidemia, unspecified: Secondary | ICD-10-CM | POA: Diagnosis not present

## 2015-09-19 DIAGNOSIS — G4730 Sleep apnea, unspecified: Secondary | ICD-10-CM | POA: Diagnosis not present

## 2015-09-19 DIAGNOSIS — Z79899 Other long term (current) drug therapy: Secondary | ICD-10-CM | POA: Insufficient documentation

## 2015-09-19 DIAGNOSIS — K219 Gastro-esophageal reflux disease without esophagitis: Secondary | ICD-10-CM | POA: Diagnosis not present

## 2015-09-19 DIAGNOSIS — Z170 Estrogen receptor positive status [ER+]: Secondary | ICD-10-CM | POA: Insufficient documentation

## 2015-09-19 DIAGNOSIS — C50912 Malignant neoplasm of unspecified site of left female breast: Secondary | ICD-10-CM | POA: Diagnosis not present

## 2015-09-19 DIAGNOSIS — N63 Unspecified lump in breast: Secondary | ICD-10-CM | POA: Diagnosis present

## 2015-09-19 DIAGNOSIS — I10 Essential (primary) hypertension: Secondary | ICD-10-CM | POA: Diagnosis not present

## 2015-09-19 DIAGNOSIS — F17210 Nicotine dependence, cigarettes, uncomplicated: Secondary | ICD-10-CM | POA: Diagnosis not present

## 2015-09-19 DIAGNOSIS — N6320 Unspecified lump in the left breast, unspecified quadrant: Secondary | ICD-10-CM

## 2015-09-19 HISTORY — DX: Low back pain, unspecified: M54.50

## 2015-09-19 HISTORY — DX: Malignant neoplasm of upper-outer quadrant of left female breast: C50.412

## 2015-09-19 HISTORY — PX: BREAST LUMPECTOMY WITH SENTINEL LYMPH NODE BIOPSY: SHX5597

## 2015-09-19 HISTORY — DX: Malignant (primary) neoplasm, unspecified: C80.1

## 2015-09-19 HISTORY — DX: Sleep apnea, unspecified: G47.30

## 2015-09-19 HISTORY — DX: Low back pain: M54.5

## 2015-09-19 HISTORY — PX: AXILLARY LYMPH NODE BIOPSY: SHX5737

## 2015-09-19 LAB — GLUCOSE, CAPILLARY: GLUCOSE-CAPILLARY: 129 mg/dL — AB (ref 65–99)

## 2015-09-19 SURGERY — BREAST LUMPECTOMY WITH SENTINEL LYMPH NODE BX
Anesthesia: General | Site: Breast | Laterality: Left | Wound class: Clean

## 2015-09-19 MED ORDER — PHENYLEPHRINE HCL 10 MG/ML IJ SOLN
INTRAMUSCULAR | Status: DC | PRN
  Administered 2015-09-19: 100 ug via INTRAVENOUS

## 2015-09-19 MED ORDER — TECHNETIUM TC 99M SULFUR COLLOID
1.0000 | Freq: Once | INTRAVENOUS | Status: AC | PRN
  Administered 2015-09-19: 0.967 via INTRAVENOUS

## 2015-09-19 MED ORDER — CEFAZOLIN SODIUM-DEXTROSE 2-3 GM-% IV SOLR
INTRAVENOUS | Status: AC
  Administered 2015-09-19: 2 g via INTRAVENOUS
  Filled 2015-09-19: qty 50

## 2015-09-19 MED ORDER — FENTANYL CITRATE (PF) 100 MCG/2ML IJ SOLN
INTRAMUSCULAR | Status: DC | PRN
  Administered 2015-09-19 (×3): 50 ug via INTRAVENOUS

## 2015-09-19 MED ORDER — ONDANSETRON HCL 4 MG/2ML IJ SOLN
INTRAMUSCULAR | Status: DC | PRN
  Administered 2015-09-19: 4 mg via INTRAVENOUS

## 2015-09-19 MED ORDER — KETOROLAC TROMETHAMINE 30 MG/ML IJ SOLN
INTRAMUSCULAR | Status: DC | PRN
  Administered 2015-09-19: 30 mg via INTRAVENOUS

## 2015-09-19 MED ORDER — SODIUM CHLORIDE 0.9 % IV SOLN
INTRAVENOUS | Status: DC
  Administered 2015-09-19: 13:00:00 via INTRAVENOUS

## 2015-09-19 MED ORDER — BUPIVACAINE-EPINEPHRINE (PF) 0.5% -1:200000 IJ SOLN
INTRAMUSCULAR | Status: DC | PRN
  Administered 2015-09-19: 30 mL via PERINEURAL

## 2015-09-19 MED ORDER — METHYLENE BLUE 0.5 % INJ SOLN
INTRAVENOUS | Status: DC | PRN
  Administered 2015-09-19: 4 mL via SUBMUCOSAL

## 2015-09-19 MED ORDER — FENTANYL CITRATE (PF) 100 MCG/2ML IJ SOLN: 25.0000 ug | INTRAMUSCULAR | Status: DC | PRN

## 2015-09-19 MED ORDER — SODIUM CHLORIDE 0.9 % IJ SOLN
INTRAMUSCULAR | Status: AC
Start: 2015-09-19 — End: 2015-09-19
  Filled 2015-09-19: qty 50

## 2015-09-19 MED ORDER — METHYLENE BLUE 0.5 % INJ SOLN
INTRAVENOUS | Status: AC
  Filled 2015-09-19: qty 10

## 2015-09-19 MED ORDER — BUPIVACAINE HCL (PF) 0.5 % IJ SOLN
INTRAMUSCULAR | Status: AC
  Filled 2015-09-19: qty 30

## 2015-09-19 MED ORDER — LACTATED RINGERS IV SOLN
INTRAVENOUS | Status: DC | PRN
Start: 2015-09-19 — End: 2015-09-19
  Administered 2015-09-19: 13:00:00 via INTRAVENOUS

## 2015-09-19 MED ORDER — DEXAMETHASONE SODIUM PHOSPHATE 10 MG/ML IJ SOLN
INTRAMUSCULAR | Status: DC | PRN
  Administered 2015-09-19: 10 mg via INTRAVENOUS

## 2015-09-19 MED ORDER — CEFAZOLIN SODIUM 1-5 GM-% IV SOLN
INTRAVENOUS | Status: DC | PRN
  Administered 2015-09-19: 2 g via INTRAVENOUS

## 2015-09-19 MED ORDER — PROPOFOL 10 MG/ML IV BOLUS
INTRAVENOUS | Status: DC | PRN
  Administered 2015-09-19: 200 mg via INTRAVENOUS

## 2015-09-19 MED ORDER — LIDOCAINE HCL (CARDIAC) 20 MG/ML IV SOLN
INTRAVENOUS | Status: DC | PRN
  Administered 2015-09-19: 100 mg via INTRAVENOUS

## 2015-09-19 MED ORDER — ACETAMINOPHEN 10 MG/ML IV SOLN
INTRAVENOUS | Status: DC | PRN
  Administered 2015-09-19: 1000 mg via INTRAVENOUS

## 2015-09-19 MED ORDER — MIDAZOLAM HCL 5 MG/5ML IJ SOLN
INTRAMUSCULAR | Status: DC | PRN
  Administered 2015-09-19: 2 mg via INTRAVENOUS

## 2015-09-19 MED ORDER — CEFAZOLIN SODIUM-DEXTROSE 2-3 GM-% IV SOLR
2.0000 g | INTRAVENOUS | Status: AC
  Administered 2015-09-19: 2 g via INTRAVENOUS

## 2015-09-19 MED ORDER — ONDANSETRON HCL 4 MG/2ML IJ SOLN: 4.0000 mg | Freq: Once | INTRAMUSCULAR | Status: DC | PRN

## 2015-09-19 MED ORDER — EPHEDRINE SULFATE 50 MG/ML IJ SOLN
INTRAMUSCULAR | Status: DC | PRN
  Administered 2015-09-19: 10 mg via INTRAVENOUS

## 2015-09-19 SURGICAL SUPPLY — 59 items
APPLIER CLIP 11 MED OPEN (CLIP) ×3
BANDAGE ELASTIC 6 LF NS (GAUZE/BANDAGES/DRESSINGS) ×3 IMPLANT
BLADE SURG 15 STRL SS SAFETY (BLADE) ×6 IMPLANT
BNDG GAUZE 4.5X4.1 6PLY STRL (MISCELLANEOUS) ×3 IMPLANT
BULB RESERV EVAC DRAIN JP 100C (MISCELLANEOUS) IMPLANT
CANISTER SUCT 1200ML W/VALVE (MISCELLANEOUS) ×3 IMPLANT
CHLORAPREP W/TINT 26ML (MISCELLANEOUS) ×3 IMPLANT
CLIP APPLIE 11 MED OPEN (CLIP) ×1 IMPLANT
CLOSURE WOUND 1/2 X4 (GAUZE/BANDAGES/DRESSINGS) ×1
CNTNR SPEC 2.5X3XGRAD LEK (MISCELLANEOUS) ×1
CONT SPEC 4OZ STER OR WHT (MISCELLANEOUS) ×2
CONTAINER SPEC 2.5X3XGRAD LEK (MISCELLANEOUS) ×1 IMPLANT
COVER PROBE FLX POLY STRL (MISCELLANEOUS) ×3 IMPLANT
DEVICE DUBIN SPECIMEN MAMMOGRA (MISCELLANEOUS) ×3 IMPLANT
DRAIN CHANNEL JP 15F RND 16 (MISCELLANEOUS) IMPLANT
DRAPE LAPAROTOMY TRNSV 106X77 (MISCELLANEOUS) ×3 IMPLANT
DRESSING TELFA 4X3 1S ST N-ADH (GAUZE/BANDAGES/DRESSINGS) ×3 IMPLANT
DRSG TEGADERM 4X4.75 (GAUZE/BANDAGES/DRESSINGS) ×3 IMPLANT
DRSG TELFA 3X8 NADH (GAUZE/BANDAGES/DRESSINGS) ×3 IMPLANT
ELECT CAUTERY BLADE TIP 2.5 (TIP) ×3
ELECT REM PT RETURN 9FT ADLT (ELECTROSURGICAL) ×3
ELECTRODE CAUTERY BLDE TIP 2.5 (TIP) ×1 IMPLANT
ELECTRODE REM PT RTRN 9FT ADLT (ELECTROSURGICAL) ×1 IMPLANT
GAUZE FLUFF 18X24 1PLY STRL (GAUZE/BANDAGES/DRESSINGS) ×3 IMPLANT
GAUZE SPONGE 4X4 12PLY STRL (GAUZE/BANDAGES/DRESSINGS) ×3 IMPLANT
GLOVE BIO SURGEON STRL SZ7.5 (GLOVE) ×3 IMPLANT
GLOVE INDICATOR 8.0 STRL GRN (GLOVE) ×3 IMPLANT
GOWN STRL REUS W/ TWL LRG LVL3 (GOWN DISPOSABLE) ×2 IMPLANT
GOWN STRL REUS W/TWL LRG LVL3 (GOWN DISPOSABLE) ×4
HARMONIC SCALPEL FOCUS (MISCELLANEOUS) ×3 IMPLANT
KIT RM TURNOVER STRD PROC AR (KITS) ×3 IMPLANT
LABEL OR SOLS (LABEL) ×3 IMPLANT
MARGIN MAP 10MM (MISCELLANEOUS) ×3 IMPLANT
NDL SAFETY 22GX1.5 (NEEDLE) ×3 IMPLANT
NEEDLE HYPO 25X1 1.5 SAFETY (NEEDLE) ×6 IMPLANT
NS IRRIG 500ML POUR BTL (IV SOLUTION) ×3 IMPLANT
PACK BASIN MINOR ARMC (MISCELLANEOUS) ×3 IMPLANT
SHEARS FOC LG CVD HARMONIC 17C (MISCELLANEOUS) ×3 IMPLANT
SLEVE PROBE SENORX GAMMA FIND (MISCELLANEOUS) ×3 IMPLANT
STRIP CLOSURE SKIN 1/2X4 (GAUZE/BANDAGES/DRESSINGS) ×2 IMPLANT
SUT ETHILON 3-0 FS-10 30 BLK (SUTURE)
SUT SILK 2 0 (SUTURE) ×2
SUT SILK 2-0 18XBRD TIE 12 (SUTURE) ×1 IMPLANT
SUT VIC AB 2-0 CT1 27 (SUTURE) ×4
SUT VIC AB 2-0 CT1 TAPERPNT 27 (SUTURE) ×2 IMPLANT
SUT VIC AB 3-0 54X BRD REEL (SUTURE) ×1 IMPLANT
SUT VIC AB 3-0 BRD 54 (SUTURE) ×2
SUT VIC AB 3-0 SH 27 (SUTURE)
SUT VIC AB 3-0 SH 27X BRD (SUTURE) IMPLANT
SUT VIC AB 4-0 FS2 27 (SUTURE) IMPLANT
SUT VIC AB 4-0 PS2 18 (SUTURE) ×3 IMPLANT
SUT VICRYL+ 3-0 144IN (SUTURE) IMPLANT
SUTURE EHLN 3-0 FS-10 30 BLK (SUTURE) IMPLANT
SWABSTK COMLB BENZOIN TINCTURE (MISCELLANEOUS) ×3 IMPLANT
SYR BULB IRRIG 60ML STRL (SYRINGE) ×3 IMPLANT
SYR CONTROL 10ML (SYRINGE) IMPLANT
SYRINGE 10CC LL (SYRINGE) ×3 IMPLANT
TAPE TRANSPORE STRL 2 31045 (GAUZE/BANDAGES/DRESSINGS) ×3 IMPLANT
WATER STERILE IRR 1000ML POUR (IV SOLUTION) IMPLANT

## 2015-09-19 NOTE — Discharge Instructions (Signed)
AMBULATORY SURGERY  DISCHARGE INSTRUCTIONS   1) The drugs that you were given will stay in your system until tomorrow so for the next 24 hours you should not:  A) Drive an automobile B) Make any legal decisions C) Drink any alcoholic beverage   2) You may resume regular meals tomorrow.  Today it is better to start with liquids and gradually work up to solid foods.  You may eat anything you prefer, but it is better to start with liquids, then soup and crackers, and gradually work up to solid foods.   3) Please notify your doctor immediately if you have any unusual bleeding, trouble breathing, redness and pain at the surgery site, drainage, fever, or pain not relieved by medication.    4) Additional Instructions:        Please contact your physician with any problems or Same Day Surgery at [phone], Monday through Friday 6 am to 4 pm, or [place] at [facility] number at [phone].Lumpectomy A lumpectomy is a form of "breast conserving" or "breast preservation" surgery. It may also be referred to as a partial mastectomy. During a lumpectomy, the portion of the breast that contains the cancerous tumor or breast mass (the lump) is removed. Some normal tissue around the lump may also be removed to make sure all of the tumor has been removed.  LET [facility] CARE PROVIDER KNOW ABOUT:  Any allergies you have.  All medicines you are taking, including vitamins, herbs, eye drops, creams, and over-the-counter medicines.  Previous problems you or members of your family have had with the use of anesthetics.  Any blood disorders you have.  Previous surgeries you have had.  Medical conditions you have. RISKS AND COMPLICATIONS Generally, this is a safe procedure. However, problems can occur and include:  Bleeding.  Infection.  Pain.  Temporary swelling.  Change in the shape of the breast, particularly if a large portion is removed. BEFORE THE PROCEDURE  Ask your  health care provider about changing or stopping your regular medicines. This is especially important if you are taking diabetes medicines or blood thinners.  Do not eat or drink anything after midnight on the night before the procedure or as directed by your health care provider. Ask your health care provider if you can take a sip of water with any approved medicines.  On the day of surgery, your health care provider will use a mammogram or ultrasound to locate and mark the tumor in your breast. These markings on your breast will show where the cut (incision) will be made. PROCEDURE   An IV tube will be put into one of your veins.  You may be given medicine to help you relax before the surgery (sedative). You will be given one of the following:  A medicine that numbs the area (local anesthetic).  A medicine that makes you fall asleep (general anesthetic).  Your health care provider will use a kind of electric scalpel that uses heat to minimize bleeding (electrocautery knife).  A curved incision (like a smile or frown) that follows the natural curve of your breast is made, to allow for minimal scarring and better healing.  The tumor will be removed with some of the surrounding tissue. This will be sent to the lab for analysis. Your health care provider may also remove your lymph nodes at this time if needed.  Sometimes, but not always, a rubber tube called a drain will be surgically inserted into your breast area or armpit  to collect excess fluid that may accumulate in the space where the tumor was. This drain is connected to a plastic bulb on the outside of your body. This drain creates suction to help remove the fluid.  The incisions will be closed with stitches (sutures).  A bandage may be placed over the incisions. AFTER THE PROCEDURE  You will be taken to the recovery area.  You will be given medicine for pain.  A small rubber drain may be placed in the breast for 2-3 days to  prevent a collection of blood (hematoma) from developing in the breast. You will be given instructions on caring for the drain before you go home.  A pressure bandage (dressing) will be applied for 1-2 days to prevent bleeding. Ask your health care provider how to care for your bandage at home.   This information is not intended to replace advice given to you by your health care provider. Make sure you discuss any questions you have with your health care provider.   Document Released: 08/18/2006 Document Revised: 07/28/2014 Document Reviewed: 12/10/2012 Elsevier Interactive Patient Education [employer].

## 2015-09-19 NOTE — H&P (Signed)
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 For wide excision, SLN biopsy. No recurrent URI symptoms.   Prescriptions prior to admission  Medication Sig Dispense Refill Last Dose  . ALPRAZolam (XANAX) 0.25 MG tablet TAKE ONE TABLET BY MOUTH 3 TIMES DAILY AS NEEDED (Patient taking differently: as needed. TAKE ONE TABLET BY MOUTH 3 TIMES DAILY AS NEEDED) 90 tablet 0 09/19/2015 at 1000  . amLODipine (NORVASC) 10 MG tablet Take 10 mg by mouth every morning.    09/19/2015 at 0830  . atorvastatin (LIPITOR) 40 MG tablet Take 40 mg by mouth every morning.   09/19/2015 at 0900  . Calcium Carb-Cholecalciferol (CALCIUM 600 + D PO) Take 1 tablet by mouth daily.   09/17/2015  . CHANTIX 1 MG tablet TAKE ONE TABLET TWICE DAILY 56 tablet 3 09/18/2015 at Unknown time  . HYDROcodone-acetaminophen (VICODIN) 5-500 MG per tablet Take 1 tablet by mouth every 6 (six) hours as needed.     09/18/2015 at 1730  . metaxalone (SKELAXIN) 800 MG tablet daily as needed. Take one by mouth at bedtime   09/18/2015 at 2200  . metFORMIN (GLUCOPHAGE) 500 MG tablet TAKE ONE TABLET TWICE DAILY WITH MEALS 180 tablet 0 09/16/2015  . Multiple Vitamin (MULTIVITAMIN) tablet Take 1 tablet by mouth daily.   09/17/2015  . pantoprazole (PROTONIX) 40 MG tablet TAKE ONE TABLET BY MOUTH EVERY DAY (Patient taking differently: TAKE ONE TABLET BY MOUTH EVERY DAY-AM) 30 tablet 1 09/19/2015 at 0930  . valsartan (DIOVAN) 160 MG tablet Take 160 mg by mouth every morning.    09/19/2015 at 0830  . vitamin C (ASCORBIC ACID) 500 MG tablet Take 500 mg by mouth daily.   09/12/2015  . Zinc 50 MG TABS Take 1 tablet by mouth daily.   09/17/2015  . zolpidem (AMBIEN CR) 6.25 MG CR tablet TAKE 1 TO 2 TABLETS AT BEDTIME AS NEEDED 60 tablet 0 09/18/2015 at Unknown time   No Known Allergies Past Medical History  Diagnosis Date  . GERD (gastroesophageal reflux disease)   . Hyperlipidemia   . Hypertension   .  Anxiety   . Diabetes mellitus without complication ([place])   . Cancer ([place])   . Sleep apnea     + SLEEP STUDY 2 YEARS AGO BUT PT STATES NO ONE EVER HELPED HER GET A CPAP MACHINE   . Lumbar pain    Past Surgical History  Procedure Laterality Date  . Tubal ligation    . [place]  [place] ([place]    6 mm hyperplastic polyp, internal hemorrhoids  . Esophagogast[place]oscopy  [place] ([place]    reflux esophagitis, gastritis  . Dilation and curettage of uterus      X 2  . Tonsillectomy      AGE 86   Social History   Social History  . Marital Status: Single    Spouse Name: N/A  . Number of Children: N/A  . Years of Education: N/A   Occupational History  . Not on file.   Social History Main Topics  . Smoking status: Current Some Day Smoker    Types: Cigarettes  . Smokeless tobacco: Never Used     Comment: PT SMOKES 1 CIG MAYBE EVERY WEEK WITH THE HELP OF CHANTIX BUT PRIOR TO THAT PT WAS SMOKING 1PPD  . Alcohol Use: 1.0 oz/week    2 Standard drinks or equivalent per week     Comment: OCC  .  Drug Use: No  . Sexual Activity: Not on file   Other Topics Concern  . Not on file   Social History Narrative   Social History   Social History Narrative     ROS: Negative.     PE: HEENT: Negative. Lungs: Clear. Cardio: RR. Robert Bellow 09/19/2015   Assessment/Plan:  Proceed with management of left breast cancer.

## 2015-09-19 NOTE — Anesthesia Preprocedure Evaluation (Signed)
Anesthesia Evaluation  Patient identified by MRN, date of birth, ID band Patient awake    Reviewed: Allergy & Precautions, NPO status , Patient's Chart, lab work & pertinent test results  Airway Mallampati: III  TM Distance: <3 FB     Dental  (+) Chipped, Caps   Pulmonary sleep apnea , Current Smoker,    Pulmonary exam normal breath sounds clear to auscultation       Cardiovascular hypertension, Pt. on medications Normal cardiovascular exam     Neuro/Psych Anxiety negative neurological ROS     GI/Hepatic Neg liver ROS, GERD  Medicated and Controlled,  Endo/Other  diabetes, Well Controlled, Type 2, Oral Hypoglycemic Agents  Renal/GU negative Renal ROS  negative genitourinary   Musculoskeletal negative musculoskeletal ROS (+)   Abdominal Normal abdominal exam  (+)   Peds negative pediatric ROS (+)  Hematology negative hematology ROS (+)   Anesthesia Other Findings   Reproductive/Obstetrics                             Anesthesia Physical Anesthesia Plan  ASA: II  Anesthesia Plan: General   Post-op Pain Management:    Induction: Intravenous  Airway Management Planned: LMA  Additional Equipment:   Intra-op Plan:   Post-operative Plan: Extubation in OR  Informed Consent: I have reviewed the patients History and Physical, chart, labs and discussed the procedure including the risks, benefits and alternatives for the proposed anesthesia with the patient or authorized representative who has indicated his/her understanding and acceptance.   Dental advisory given  Plan Discussed with: CRNA and Surgeon  Anesthesia Plan Comments:         Anesthesia Quick Evaluation

## 2015-09-19 NOTE — Transfer of Care (Signed)
Immediate Anesthesia Transfer of Care Note  Patient: Connie Maldonado  Procedure(s) Performed: Procedure(s): BREAST LUMPECTOMY WITH SENTINEL LYMPH NODE BX (Left) AXILLARY LYMPH NODE BIOPSY (Left)  Patient Location: PACU  Anesthesia Type:General  Level of Consciousness: sedated  Airway & Oxygen Therapy: Patient Spontanous Breathing and Patient connected to face mask oxygen  Post-op Assessment: Report given to RN  Post vital signs: Reviewed and stable  Last Vitals:  Filed Vitals:   09/19/15 1237 09/19/15 1514  BP: 157/75 126/69  Pulse: 102 97  Temp: 37.3 C 36.7 C  Resp: 16 18    Complications: No apparent anesthesia complications

## 2015-09-19 NOTE — Anesthesia Postprocedure Evaluation (Signed)
Anesthesia Post Note  Patient: Connie Maldonado  Procedure(s) Performed: Procedure(s) (LRB): BREAST LUMPECTOMY WITH SENTINEL LYMPH NODE BX (Left) AXILLARY LYMPH NODE BIOPSY (Left)  Patient location during evaluation: PACU Anesthesia Type: General Level of consciousness: awake and alert Pain management: pain level controlled Vital Signs Assessment: post-procedure vital signs reviewed and stable Respiratory status: spontaneous breathing, nonlabored ventilation, respiratory function stable and patient connected to nasal cannula oxygen Cardiovascular status: blood pressure returned to baseline and stable Postop Assessment: no signs of nausea or vomiting Anesthetic complications: no    Last Vitals:  Filed Vitals:   09/19/15 1557 09/19/15 1605  BP:  142/71  Pulse: 102 95  Temp: 36.8 C 36.7 C  Resp: 18 16    Last Pain:  Filed Vitals:   09/19/15 1609  PainSc: 3                  Broadus John K Naresh Althaus

## 2015-09-19 NOTE — Op Note (Signed)
Preoperative diagnosis: Left breast cancer.  Post operative diagnosis: Same.  Procedure: Left breast wide excision with ultrasound guidance, attempted SLN biopsy.   Surgeon: Hervey Ard, M.D.  Anesthesia: Gen, LMA; Marcaine 0.5% plain, 30 cc  EBL: < 30 cc.  Clinical note: This 60 year old woman had an abnormal mammogram and core biopsy showed evidence of a 2 mm invasive mammary carcinoma. She desired breast conservation. She was injected with technetium sulfur colloid prior to presenting to the surgical suite.   Operative note: With the patient under adequate general anesthesia the breast was prepped with alcohol and a total of 4 mL of mixed of normal saline/methylene blue diluted2:1 was injected in the subareolar plexus. The breast, axilla and chest wall was then preped the area Chloraprep and draped.  Ultrasound was used to identify the tumor in the three o'clock position of the left breast. A radial incision was made at this point. Skin incision was extended down to the adipose layer and then swept superiorly and inferiorly to encompass a 3 x 5 x 7 cm block of tissue. This was orientated and specimen radiograph confirmed the previously placed clip present. Examination by the pathology service reported the closest margin was superficial and medial. Jermaine new centimeter wedge of tissue from the superficial medial margin was excised, orientated and placed in formalin for routine histology.  Examination of the axilla was completed both through the wide excision site as well as from a separate incision in the axilla.In spite of varying areas of increased count up to 80 no nodal tissue was identified. 45 minutes this portion of the procedure was abandoned.  The breast wound was approximated in multiple layers of 2-0 Vicryl figure-of-eight sutures followed by a running 4-0 Vicryl subcuticular suture for the skin. The axillary wound was closed with interrupted 3-0 Vicryl figure-of-eight sutures  and a running 4-0 Vicryl septic suture for the skin. Benzoin and Steri-Strip was applied. Fluff gauze, Kerlix and Ace wrap was applied.  The patient tolerated the procedure well and was brought to the recovery condition.

## 2015-09-19 NOTE — Anesthesia Procedure Notes (Signed)
Procedure Name: LMA Insertion Date/Time: 09/19/2015 1:32 PM Performed by: Marsh Dolly Pre-anesthesia Checklist: Patient identified, Patient being monitored, Timeout performed, Emergency Drugs available and Suction available Patient Re-evaluated:Patient Re-evaluated prior to inductionOxygen Delivery Method: Circle system utilized Preoxygenation: Pre-oxygenation with 100% oxygen Intubation Type: IV induction Ventilation: Mask ventilation without difficulty LMA: LMA inserted LMA Size: 3.5 Tube type: Oral Number of attempts: 1 Placement Confirmation: positive ETCO2 and breath sounds checked- equal and bilateral Tube secured with: Tape Dental Injury: Teeth and Oropharynx as per pre-operative assessment

## 2015-09-20 ENCOUNTER — Encounter: Payer: Self-pay | Admitting: General Surgery

## 2015-09-21 ENCOUNTER — Telehealth: Payer: Self-pay | Admitting: General Surgery

## 2015-09-21 LAB — SURGICAL PATHOLOGY

## 2015-09-21 LAB — GLUCOSE, CAPILLARY: GLUCOSE-CAPILLARY: 137 mg/dL — AB (ref 65–99)

## 2015-09-21 NOTE — Telephone Encounter (Signed)
The patient was notified that the margins of resection were negative.  She reports she's has soreness in the left axilla, not surprising after a 45 minute expedition in search of the sentinel node.  We'll have the office contact her on Monday, March 6 to arrange for a follow-up visit on Wednesday, March 8.

## 2015-09-24 ENCOUNTER — Ambulatory Visit: Payer: BLUE CROSS/BLUE SHIELD | Admitting: General Surgery

## 2015-09-26 ENCOUNTER — Telehealth: Payer: Self-pay | Admitting: Internal Medicine

## 2015-09-26 ENCOUNTER — Encounter: Payer: Self-pay | Admitting: General Surgery

## 2015-09-26 ENCOUNTER — Other Ambulatory Visit (INDEPENDENT_AMBULATORY_CARE_PROVIDER_SITE_OTHER): Payer: BLUE CROSS/BLUE SHIELD

## 2015-09-26 ENCOUNTER — Ambulatory Visit: Payer: BLUE CROSS/BLUE SHIELD | Admitting: General Surgery

## 2015-09-26 VITALS — BP 160/80 | HR 90 | Resp 16 | Ht 67.0 in | Wt 209.0 lb

## 2015-09-26 DIAGNOSIS — C50412 Malignant neoplasm of upper-outer quadrant of left female breast: Secondary | ICD-10-CM

## 2015-09-26 NOTE — Progress Notes (Signed)
Patient ID: Connie Maldonado, female   DOB: 15-Jul-1956, 60 y.o.   MRN: [id number]  Chief Complaint  Patient presents with  . Routine Post Op    left breast wide excision    HPI Connie Maldonado is a 60 y.o. female here today for her post op left breast wide excision done on 09/19/15. Patient states she is doing well.  HPI  Past Medical History  Diagnosis Date  . GERD (gastroesophageal reflux disease)   . Hyperlipidemia   . Hypertension   . Anxiety   . Diabetes mellitus without complication ([place])   . Cancer ([place])   . Sleep apnea     + SLEEP STUDY 2 YEARS AGO BUT PT STATES NO ONE EVER HELPED HER GET A CPAP MACHINE   . Lumbar pain     Past Surgical History  Procedure Laterality Date  . Tubal ligation    . [place]  [place] ([place]    6 mm hyperplastic polyp, internal hemorrhoids  . Esophagogast[place]oscopy  [place] ([place]    reflux esophagitis, gastritis  . Dilation and curettage of uterus      X 2  . Tonsillectomy      AGE 4  . Breast lumpectomy with sentinel lymph node biopsy Left 09/19/2015    Procedure: BREAST LUMPECTOMY WITH SENTINEL LYMPH NODE BX;  Surgeon: Robert Bellow, MD;  Location: [facility] ORS;  Service: General;  Laterality: Left;  . Axillary lymph node biopsy Left 09/19/2015    Procedure: AXILLARY LYMPH NODE BIOPSY;  Surgeon: Robert Bellow, MD;  Location: [facility] ORS;  Service: General;  Laterality: Left;    Family History  Problem Relation Age of Onset  . Heart disease Mother   . Hypertension Mother   . Cancer Father     stomach  . Stomach cancer Father 9  . Colon cancer Father     Social History Social History  Substance Use Topics  . Smoking status: Current Some Day Smoker    Types: Cigarettes  . Smokeless tobacco: Never Used     Comment: PT SMOKES 1 CIG MAYBE EVERY WEEK WITH THE HELP OF CHANTIX BUT PRIOR TO THAT PT WAS SMOKING 1PPD  . Alcohol Use: 1.0 oz/week    2 Standard drinks or equivalent per week     Comment: OCC    No Known  Allergies  Current Outpatient Prescriptions  Medication Sig Dispense Refill  . ALPRAZolam (XANAX) 0.25 MG tablet TAKE ONE TABLET BY MOUTH 3 TIMES DAILY AS NEEDED (Patient taking differently: as needed. TAKE ONE TABLET BY MOUTH 3 TIMES DAILY AS NEEDED) 90 tablet 0  . amLODipine (NORVASC) 10 MG tablet Take 10 mg by mouth every morning.     Marland Kitchen atorvastatin (LIPITOR) 40 MG tablet Take 40 mg by mouth every morning.    . Calcium Carb-Cholecalciferol (CALCIUM 600 + D PO) Take 1 tablet by mouth daily.    . CHANTIX 1 MG tablet TAKE ONE TABLET TWICE DAILY 56 tablet 3  . metaxalone (SKELAXIN) 800 MG tablet daily as needed. Take one by mouth at bedtime    . metFORMIN (GLUCOPHAGE) 500 MG tablet TAKE ONE TABLET TWICE DAILY WITH MEALS 180 tablet 0  . Multiple Vitamin (MULTIVITAMIN) tablet Take 1 tablet by mouth daily.    . pantoprazole (PROTONIX) 40 MG tablet TAKE ONE TABLET BY MOUTH EVERY DAY (Patient taking differently: TAKE ONE TABLET BY MOUTH EVERY DAY-AM) 30 tablet 1  . valsartan (DIOVAN) 160 MG tablet Take 160 mg by mouth every morning.     Marland Kitchen  vitamin C (ASCORBIC ACID) 500 MG tablet Take 500 mg by mouth daily.    . Zinc 50 MG TABS Take 1 tablet by mouth daily.    Marland Kitchen zolpidem (AMBIEN CR) 6.25 MG CR tablet TAKE 1 TO 2 TABLETS AT BEDTIME AS NEEDED 60 tablet 0  . HYDROcodone-acetaminophen (VICODIN) 5-500 MG per tablet Take 1 tablet by mouth every 6 (six) hours as needed. Reported on 09/26/2015     No current facility-administered medications for this visit.    Review of Systems Review of Systems  Constitutional: Negative.   Respiratory: Negative.   Cardiovascular: Negative.     Blood pressure 160/80, pulse 90, resp. rate 16, height '5\' 7"'$  (1.702 m), weight 209 lb (94.802 kg).  Physical Exam Physical Exam  Constitutional: She is oriented to person, place, and time. She appears well-nourished.  Pulmonary/Chest:    Left breast incision is clean and healing well.   Neurological: She is alert and  oriented to person, place, and time.  Skin: Skin is warm and dry.    Data Reviewed Pathology showed a 4 mm invasive mammary carcinoma. ER positive, PR positive, HER-2/neu not amplified. No sentinel node identified.  Ultrasound examination of the wide excision site showed a moderate cavity measuring 1.4 x 1.4 x 2.7 cm. This is at a minimum distance of 1.3 cm from the skin. Candidate for partial breast radiation.  Assessment    Doing well status post left breast wide excision.    Plan    We'll arrange for radiation oncology evaluation per sees MammoSite candidate) sees as well as an opinion from medical oncology (as lymph nodes were not removed). Likely the patient will be managed with an aromatase inhibitor post radiation therapy.        Patient to follow up with our office in 2 weeks.   Arrangements will also be made for the patient to see Dr. Baruch Gouty and Dr. Mike Gip at the [facility].   This information has been scribed by Gaspar Cola CMA.   Robert Bellow 09/26/2015, 4:25 PM

## 2015-09-26 NOTE — Patient Instructions (Signed)
Return in 2 weeks.   Arrangements will also be made for the patient to see Dr. Baruch Gouty and Dr. Mike Gip at the [facility]. Patient will be contacted with day and time of appointment.

## 2015-09-26 NOTE — Telephone Encounter (Signed)
    Hi Connie Maldonado,  Can you please get this pt scheduled?       Previous Messages     ----- Message -----   From: Robert Bellow, MD   Sent: 09/26/2015  3:08 PM    To: Connie Passy, MD   This patient was followed in your office until Spring 2015 when her insurance changed and she moved to [place]. He is now "out of network", and you are back in.   Could you consider taking her back into your practice?  She had called the office and was told that you were not taking new patients.  Thanks for the consideration.        Appointment 3/13 Pt aware

## 2015-10-01 ENCOUNTER — Ambulatory Visit (INDEPENDENT_AMBULATORY_CARE_PROVIDER_SITE_OTHER): Payer: BLUE CROSS/BLUE SHIELD | Admitting: Family Medicine

## 2015-10-01 ENCOUNTER — Encounter: Payer: Self-pay | Admitting: Family Medicine

## 2015-10-01 VITALS — BP 128/70 | HR 107 | Temp 98.0°F | Wt 210.5 lb

## 2015-10-01 DIAGNOSIS — C50412 Malignant neoplasm of upper-outer quadrant of left female breast: Secondary | ICD-10-CM | POA: Diagnosis not present

## 2015-10-01 DIAGNOSIS — E785 Hyperlipidemia, unspecified: Secondary | ICD-10-CM

## 2015-10-01 DIAGNOSIS — K648 Other hemorrhoids: Secondary | ICD-10-CM

## 2015-10-01 DIAGNOSIS — G4700 Insomnia, unspecified: Secondary | ICD-10-CM | POA: Diagnosis not present

## 2015-10-01 DIAGNOSIS — G4733 Obstructive sleep apnea (adult) (pediatric): Secondary | ICD-10-CM

## 2015-10-01 DIAGNOSIS — K219 Gastro-esophageal reflux disease without esophagitis: Secondary | ICD-10-CM

## 2015-10-01 DIAGNOSIS — F17200 Nicotine dependence, unspecified, uncomplicated: Secondary | ICD-10-CM | POA: Diagnosis not present

## 2015-10-01 DIAGNOSIS — Z23 Encounter for immunization: Secondary | ICD-10-CM | POA: Diagnosis not present

## 2015-10-01 DIAGNOSIS — E118 Type 2 diabetes mellitus with unspecified complications: Secondary | ICD-10-CM

## 2015-10-01 DIAGNOSIS — I10 Essential (primary) hypertension: Secondary | ICD-10-CM

## 2015-10-01 LAB — LIPID PANEL
CHOL/HDL RATIO: 4
CHOLESTEROL: 138 mg/dL (ref 0–200)
HDL: 37.7 mg/dL — ABNORMAL LOW (ref >39.00)
NonHDL: 100.4
Triglycerides: 210 mg/dL — ABNORMAL HIGH (ref 0.0–149.0)
VLDL: 42 mg/dL — ABNORMAL HIGH (ref 0.0–40.0)

## 2015-10-01 LAB — COMPREHENSIVE METABOLIC PANEL
ALBUMIN: 4.3 g/dL (ref 3.5–5.2)
ALK PHOS: 87 U/L (ref 39–117)
ALT: 79 U/L — AB (ref 0–35)
AST: 109 U/L — AB (ref 0–37)
BUN: 8 mg/dL (ref 6–23)
CO2: 28 mEq/L (ref 19–32)
CREATININE: 0.61 mg/dL (ref 0.40–1.20)
Calcium: 9.5 mg/dL (ref 8.4–10.5)
Chloride: 98 mEq/L (ref 96–112)
GFR: 106.5 mL/min (ref >60.00)
Glucose, Bld: 205 mg/dL — ABNORMAL HIGH (ref 70–99)
Potassium: 3.9 mEq/L (ref 3.5–5.1)
SODIUM: 137 meq/L (ref 135–145)
TOTAL PROTEIN: 7.6 g/dL (ref 6.0–8.3)
Total Bilirubin: 0.5 mg/dL (ref 0.2–1.2)

## 2015-10-01 LAB — LDL CHOLESTEROL, DIRECT: Direct LDL: 83 mg/dL

## 2015-10-01 LAB — HEMOGLOBIN A1C: Hgb A1c MFr Bld: 9 % — ABNORMAL HIGH (ref 4.6–6.5)

## 2015-10-01 MED ORDER — ESOMEPRAZOLE MAGNESIUM 40 MG PO CPDR: 40.0000 mg | DELAYED_RELEASE_CAPSULE | Freq: Every day | ORAL | Status: DC

## 2015-10-01 MED ORDER — AMLODIPINE BESYLATE 10 MG PO TABS: 10.0000 mg | ORAL_TABLET | ORAL | Status: AC

## 2015-10-01 MED ORDER — VALSARTAN 160 MG PO TABS: 160.0000 mg | ORAL_TABLET | ORAL | Status: DC

## 2015-10-01 NOTE — Assessment & Plan Note (Signed)
Has follow up with oncology and radonc next week.

## 2015-10-01 NOTE — Assessment & Plan Note (Signed)
Due for labs. No changes made to dose of metformin today.

## 2015-10-01 NOTE — Progress Notes (Signed)
Subjective:    Patient ID: Connie Maldonado, female    DOB: [birth year], 60 y.o.   MRN: [id number]  60 yo pleasant female here to re establish care.  Unfortunately, since I last saw her two years ago, she has been diagnosed with breast cancer. S/p left breast wide excision on 09/19/15.  Note reviewed from OV follow up with Dr. Hervey Ard from 09/26/15. Mammosite candidate.  Was also referred to [facility] oncology.  GERD- feels protonix is no longer working.  Would like to try a different PPI.  Nexium has worked well in past.  HLD- still taking zocor 40 mg daily. Lab Results  Component Value Date   CHOL 164 11/09/2013   HDL 51.90 11/09/2013   LDLCALC 82 11/09/2013   TRIG 150.0* 11/09/2013   CHOLHDL 3 11/09/2013    DM- Metformin 500 mg twice daily.   Wt Readings from Last 3 Encounters:  10/01/15 210 lb 8 oz (95.482 kg)  09/26/15 209 lb (94.802 kg)  09/10/15 215 lb (97.523 kg)  Denies any episodes of hypoglycemia.  Denies any increased thirst or urination. Lab Results  Component Value Date   HGBA1C 6.2 11/09/2013   Insomnia- Ambien Cr as needed.  Does not take every night.   HTN- stable on Norvasc 10 mg daily and Diovan 160 mg daily.  Denies any CP, SOB, HA or blurred vision. Lab Results  Component Value Date   CREATININE 0.58 09/12/2015   OSA- diagnosed with sleep apnea per pt.  Having trouble getting CPAP machine and supplies.  Patient Active Problem List   Diagnosis Date Noted  . Malignant neoplasm of upper-outer quadrant of left female breast ([place]) 07/13/2015  . Internal hemorrhoids 12/27/2012  . Insomnia 01/08/2012  . Diabetes mellitus ([place]) 07/29/2011  . Elevated liver function tests 04/17/2011  . TOBACCO ABUSE 04/16/2010  . ADJUSTMENT DISORDER WITH ANXIETY 10/23/2009  . HLD (hyperlipidemia) 05/16/2009  . Essential hypertension 05/16/2009  . GERD 05/16/2009   Past Medical History  Diagnosis Date  . GERD (gastroesophageal reflux disease)   . Hyperlipidemia    . Hypertension   . Anxiety   . Diabetes mellitus without complication ([place])   . Cancer ([place])   . Sleep apnea     + SLEEP STUDY 2 YEARS AGO BUT PT STATES NO ONE EVER HELPED HER GET A CPAP MACHINE   . Lumbar pain   . Anemia    Past Surgical History  Procedure Laterality Date  . Tubal ligation    . [place]  [place] ([place]    6 mm hyperplastic polyp, internal hemorrhoids  . Esophagogast[place]  [place] ([place]    reflux esophagitis, gastritis  . Dilation and curettage of uterus      X 2  . Tonsillectomy      AGE 22  . Breast lumpectomy with sentinel lymph node biopsy Left 09/19/2015    Procedure: BREAST LUMPECTOMY WITH SENTINEL LYMPH NODE BX;  Surgeon: Robert Bellow, MD;  Location: [facility] ORS;  Service: General;  Laterality: Left;  . Axillary lymph node biopsy Left 09/19/2015    Procedure: AXILLARY LYMPH NODE BIOPSY;  Surgeon: Robert Bellow, MD;  Location: [facility] ORS;  Service: General;  Laterality: Left;   Social History  Substance Use Topics  . Smoking status: Former Smoker    Types: Cigarettes  . Smokeless tobacco: Never Used     Comment: PT SMOKES 1 CIG MAYBE EVERY WEEK WITH THE HELP OF CHANTIX BUT PRIOR TO THAT PT WAS SMOKING 1PPD  . Alcohol  Use: 1.0 oz/week    2 Standard drinks or equivalent per week     Comment: OCC   Family History  Problem Relation Age of Onset  . Heart disease Mother   . Hypertension Mother   . Cancer Father     stomach  . Stomach cancer Father 51  . Colon cancer Father    No Known Allergies Current Outpatient Prescriptions on File Prior to Visit  Medication Sig Dispense Refill  . ALPRAZolam (XANAX) 0.25 MG tablet TAKE ONE TABLET BY MOUTH 3 TIMES DAILY AS NEEDED (Patient taking differently: as needed. TAKE ONE TABLET BY MOUTH 3 TIMES DAILY AS NEEDED) 90 tablet 0  . amLODipine (NORVASC) 10 MG tablet Take 10 mg by mouth every morning.     Marland Kitchen atorvastatin (LIPITOR) 40 MG tablet Take 40 mg by mouth every morning.    . Calcium  Carb-Cholecalciferol (CALCIUM 600 + D PO) Take 1 tablet by mouth daily.    . CHANTIX 1 MG tablet TAKE ONE TABLET TWICE DAILY 56 tablet 3  . HYDROcodone-acetaminophen (VICODIN) 5-500 MG per tablet Take 1 tablet by mouth every 6 (six) hours as needed. Reported on 09/26/2015    . metaxalone (SKELAXIN) 800 MG tablet daily as needed. Take one by mouth at bedtime    . metFORMIN (GLUCOPHAGE) 500 MG tablet TAKE ONE TABLET TWICE DAILY WITH MEALS 180 tablet 0  . Multiple Vitamin (MULTIVITAMIN) tablet Take 1 tablet by mouth daily.    . pantoprazole (PROTONIX) 40 MG tablet TAKE ONE TABLET BY MOUTH EVERY DAY (Patient taking differently: TAKE ONE TABLET BY MOUTH EVERY DAY-AM) 30 tablet 1  . valsartan (DIOVAN) 160 MG tablet Take 160 mg by mouth every morning.     . vitamin C (ASCORBIC ACID) 500 MG tablet Take 500 mg by mouth daily.    . Zinc 50 MG TABS Take 1 tablet by mouth daily.    Marland Kitchen zolpidem (AMBIEN CR) 6.25 MG CR tablet TAKE 1 TO 2 TABLETS AT BEDTIME AS NEEDED 60 tablet 0   No current facility-administered medications on file prior to visit.   The PMH, PSH, Social History, Family History, Medications, and allergies have been reviewed in [facility], and have been updated if relevant.   Review of Systems  Constitutional: Negative.   HENT: Negative.   Eyes: Negative.   Respiratory: Negative.   Cardiovascular: Negative.   Gastrointestinal: Negative.   Endocrine: Negative.   Genitourinary: Negative.   Musculoskeletal: Negative.   Skin: Negative.   Allergic/Immunologic: Negative.   Neurological: Negative.   Hematological: Negative.   Psychiatric/Behavioral: Negative.   All other systems reviewed and are negative.       Objective:   Physical Exam BP 128/70 mmHg  Pulse 107  Temp(Src) 98 F (36.7 C) (Oral)  Wt 210 lb 8 oz (95.482 kg)  SpO2 96% Wt Readings from Last 3 Encounters:  10/01/15 210 lb 8 oz (95.482 kg)  09/26/15 209 lb (94.802 kg)  09/10/15 215 lb (97.523 kg)     General:   Well-developed,well-nourished,in no acute distress; alert,appropriate and cooperative throughout examination Head:  normocephalic and atraumatic.   Eyes:  vision grossly intact, pupils equal, pupils round, and pupils reactive to light.   Ears:  R ear normal and L ear normal.   Nose:  no external deformity.   Mouth:  good dentition.   Lungs:  Normal respiratory effort, chest expands symmetrically. Lungs are clear to auscultation, no crackles or wheezes. Heart:  Normal rate and regular rhythm. S1  and S2 normal without gallop, murmur, click, rub or other extra sounds. Msk:  No deformity or scoliosis noted of thoracic or lumbar spine.   Extremities:  No clubbing, cyanosis, edema, or deformity noted with normal full range of motion of all joints.   Neurologic:  alert & oriented X3 and gait normal.   Skin:  Intact without suspicious lesions or rashes Psych:  Cognition and judgment appear intact. Alert and cooperative with normal attention span and concentration. No apparent delusions, illusions, hallucinations       Assessment & Plan:

## 2015-10-01 NOTE — Assessment & Plan Note (Signed)
Well controlled, at goal for a diabetic. eRx refilled today.

## 2015-10-01 NOTE — Progress Notes (Signed)
Pre visit review using our clinic review tool, if applicable. No additional management support is needed unless otherwise documented below in the visit note. 

## 2015-10-01 NOTE — Addendum Note (Signed)
Addended by: Modena Nunnery on: 10/01/2015 11:40 AM   Modules accepted: Orders

## 2015-10-01 NOTE — Assessment & Plan Note (Signed)
Deteriorated. D/c protonix. eRx sent for nexium. Call or return to clinic prn if these symptoms worsen or fail to improve as anticipated. The patient indicates understanding of these issues and agrees with the plan.

## 2015-10-01 NOTE — Assessment & Plan Note (Signed)
Well refer to sleep center for CPAP machine and supplies. The patient indicates understanding of these issues and agrees with the plan.

## 2015-10-01 NOTE — Patient Instructions (Signed)
Great to see you. Please stop by to see Rosaria Ferries on your way out.  We will call you with your labs.

## 2015-10-01 NOTE — Assessment & Plan Note (Signed)
Still smoking and taking chantix. More motivated to quit in light of recent cancer diagnosis.

## 2015-10-01 NOTE — Addendum Note (Signed)
Addended by: Lucille Passy on: 10/01/2015 11:28 AM   Modules accepted: Level of Service

## 2015-10-01 NOTE — Assessment & Plan Note (Signed)
Continue current dose of statin. Due for labs today. 

## 2015-10-03 ENCOUNTER — Inpatient Hospital Stay: Payer: BLUE CROSS/BLUE SHIELD | Attending: Hematology and Oncology | Admitting: Hematology and Oncology

## 2015-10-03 ENCOUNTER — Encounter: Payer: Self-pay | Admitting: Hematology and Oncology

## 2015-10-03 VITALS — BP 135/82 | HR 99 | Temp 98.1°F | Resp 18 | Ht 67.0 in | Wt 213.1 lb

## 2015-10-03 DIAGNOSIS — Z800 Family history of malignant neoplasm of digestive organs: Secondary | ICD-10-CM | POA: Insufficient documentation

## 2015-10-03 DIAGNOSIS — G4730 Sleep apnea, unspecified: Secondary | ICD-10-CM | POA: Diagnosis not present

## 2015-10-03 DIAGNOSIS — Z170 Estrogen receptor positive status [ER+]: Secondary | ICD-10-CM | POA: Diagnosis not present

## 2015-10-03 DIAGNOSIS — Z79899 Other long term (current) drug therapy: Secondary | ICD-10-CM

## 2015-10-03 DIAGNOSIS — Z86010 Personal history of colonic polyps: Secondary | ICD-10-CM | POA: Diagnosis not present

## 2015-10-03 DIAGNOSIS — Z87891 Personal history of nicotine dependence: Secondary | ICD-10-CM | POA: Diagnosis not present

## 2015-10-03 DIAGNOSIS — K210 Gastro-esophageal reflux disease with esophagitis: Secondary | ICD-10-CM | POA: Diagnosis not present

## 2015-10-03 DIAGNOSIS — Z7984 Long term (current) use of oral hypoglycemic drugs: Secondary | ICD-10-CM | POA: Diagnosis not present

## 2015-10-03 DIAGNOSIS — C50412 Malignant neoplasm of upper-outer quadrant of left female breast: Secondary | ICD-10-CM | POA: Diagnosis present

## 2015-10-03 DIAGNOSIS — E785 Hyperlipidemia, unspecified: Secondary | ICD-10-CM | POA: Diagnosis not present

## 2015-10-03 DIAGNOSIS — I10 Essential (primary) hypertension: Secondary | ICD-10-CM

## 2015-10-03 DIAGNOSIS — E119 Type 2 diabetes mellitus without complications: Secondary | ICD-10-CM | POA: Diagnosis not present

## 2015-10-03 DIAGNOSIS — F419 Anxiety disorder, unspecified: Secondary | ICD-10-CM | POA: Diagnosis not present

## 2015-10-03 NOTE — Patient Instructions (Signed)
Letrozole tablets What is this medicine? LETROZOLE (LET roe zole) blocks the production of estrogen. Certain types of breast cancer grow under the influence of estrogen. Letrozole helps block tumor growth. This medicine is used to treat advanced breast cancer in postmenopausal women. This medicine may be used for other purposes; ask your health care provider or pharmacist if you have questions. What should I tell my health care provider before I take this medicine? They need to know if you have any of these conditions: -liver disease -osteoporosis (weak bones) -an unusual or allergic reaction to letrozole, other medicines, foods, dyes, or preservatives -pregnant or trying to get pregnant -breast-feeding How should I use this medicine? Take this medicine by mouth with a glass of water. You may take it with or without food. Follow the directions on the prescription label. Take your medicine at regular intervals. Do not take your medicine more often than directed. Do not stop taking except on your doctor's advice. Talk to your pediatrician regarding the use of this medicine in children. Special care may be needed. Overdosage: If you think you have taken too much of this medicine contact a poison control center or emergency room at once. NOTE: This medicine is only for you. Do not share this medicine with others. What if I miss a dose? If you miss a dose, take it as soon as you can. If it is almost time for your next dose, take only that dose. Do not take double or extra doses. What may interact with this medicine? Do not take this medicine with any of the following medications: -estrogens, like hormone replacement therapy or birth control pills This medicine may also interact with the following medications: -dietary supplements such as androstenedione or DHEA -prasterone -tamoxifen This list may not describe all possible interactions. Give your health care provider a list of all the medicines,  herbs, non-prescription drugs, or dietary supplements you use. Also tell them if you smoke, drink alcohol, or use illegal drugs. Some items may interact with your medicine. What should I watch for while using this medicine? Visit your doctor or health care professional for regular check-ups to monitor your condition. Do not use this drug if you are pregnant. Serious side effects to an unborn child are possible. Talk to your doctor or pharmacist for more information. You may get drowsy or dizzy. Do not drive, use machinery, or do anything that needs mental alertness until you know how this medicine affects you. Do not stand or sit up quickly, especially if you are an older patient. This reduces the risk of dizzy or fainting spells. What side effects may I notice from receiving this medicine? Side effects that you should report to your doctor or health care professional as soon as possible: -allergic reactions like skin rash, itching, or hives -bone fracture -chest pain -difficulty breathing or shortness of breath -severe pain, swelling, warmth in the leg -unusually weak or tired -vaginal bleeding Side effects that usually do not require medical attention (report to your doctor or health care professional if they continue or are bothersome): -bone, back, joint, or muscle pain -dizziness -fatigue -fluid retention -headache -hot flashes, night sweats -nausea -weight gain This list may not describe all possible side effects. Call your doctor for medical advice about side effects. You may report side effects to FDA at 1-800-FDA-1088. Where should I keep my medicine? Keep out of the reach of children. Store between 15 and 30 degrees C (59 and 86 degrees F). Throw away   any unused medicine after the expiration date. NOTE: This sheet is a summary. It may not cover all possible information. If you have questions about this medicine, talk to your doctor, pharmacist, or health care provider.     2016, Elsevier/Gold Standard. (2007-09-17 16:43:44)  

## 2015-10-03 NOTE — Progress Notes (Signed)
[facility] day:  10/03/2015  Chief Complaint: Connie Maldonado is a 60 y.o. female with stage I left breast cancer who is referred in consultation by Dr. Hervey Ard for assessment and management.  HPI:  The patient underwent screening mammogram on 05/29/2015 at [facility].  Imaging revealed possible asymmetry in the outer left breast.  Mammogram and ultrasound of the left breast on 06/19/2015 revealed an 8 mm mass in the 3 o'clock position of the left breast suspicious for malignancy.  Ultrasound revealed an 8 x 8 x 5 mm irregular poorly defined hypoechoic mass at the 3 o'clock position approximately 6 cm from the nipple.  Ultrasound guided biopsy on 07/03/2015 revealed grade I invasive mammary carcinoma.  Sample was 2 mm.  Tumor was ER > 90%, PR > 90%, and Her2/neu 1+.  A clip was placed.  She was seen in consultation by Dr. Bary Castilla on 07/13/2015.  Surgical options (breast conservation and mastectomy) were discussed.  She states that she took off for the holidays.  She had some issues with a sinus infection.  She returned for follow-up on 09/12/2015.  She underwent wide excision and attempt at sentinel node biopsy on 09/19/2015.  Despite a 45 minute axillary exploration, no sentinel node was identified.  Pathology revealed a 4 mm grade I invasive mammary carcinoma.  There was focal florid ductal hyperplasia.    Margins were negative.  She meets with Dr. Baruch Gouty, radiation oncologist, tomorrow to discuss external beam versus Mammosite.  She notes menses at age 86 and menopause at age 69.  She was on birth control pills for 5-6 years.  She was never on post menopausal hormonal replacement.  She has 1 son (breast fed).  There is no family history of breast cancer.  Her father had colon or gastric cancer.  She had a colonoscopy on 08/18/2014.  She had 3 hepatic flexure polyps removed (tubar adenomas) and a rectosigmoid polyp (leiomyomatous  polyp).  Symptomatically, she feels good except for slight tenderness in the left breast and axillary region.  Past Medical History  Diagnosis Date  . GERD (gastroesophageal reflux disease)   . Hyperlipidemia   . Hypertension   . Anxiety   . Diabetes mellitus without complication ([place])   . Cancer ([place])   . Sleep apnea     + SLEEP STUDY 2 YEARS AGO BUT PT STATES NO ONE EVER HELPED HER GET A CPAP MACHINE   . Lumbar pain   . Anemia     Past Surgical History  Procedure Laterality Date  . Tubal ligation    . [place]  [place]    6 mm hyperplastic polyp, internal hemorrhoids  . Esophagogast[place]odenoscopy  [place] ([place]    reflux esophagitis, gastritis  . Dilation and curettage of uterus      X 2  . Tonsillectomy      AGE 19  . Breast lumpectomy with sentinel lymph node biopsy Left 09/19/2015    Procedure: BREAST LUMPECTOMY WITH SENTINEL LYMPH NODE BX;  Surgeon: Robert Bellow, MD;  Location: [facility] ORS;  Service: General;  Laterality: Left;  . Axillary lymph node biopsy Left 09/19/2015    Procedure: AXILLARY LYMPH NODE BIOPSY;  Surgeon: Robert Bellow, MD;  Location: [facility] ORS;  Service: General;  Laterality: Left;    Family History  Problem Relation Age of Onset  . Heart disease Mother   . Hypertension Mother   . Cancer Father     stomach  .  Stomach cancer Father 62  . Colon cancer Father     Social History:  reports that she has quit smoking. Her smoking use included Cigarettes. She has never used smokeless tobacco. She reports that she drinks about 1.0 oz of alcohol per week. She reports that she does not use illicit drugs.  She is from [place].  She moved to [place] in 2010.  She is in the process of moving to [place], [place], to be with her son and grand-daughter.  She plans on keeping her physicians in the [place] area despite her move.  The patient is alone today.  Allergies: No Known Allergies  Current Medications: Current Outpatient  Prescriptions  Medication Sig Dispense Refill  . ALPRAZolam (XANAX) 0.25 MG tablet TAKE ONE TABLET BY MOUTH 3 TIMES DAILY AS NEEDED (Patient taking differently: as needed. TAKE ONE TABLET BY MOUTH 3 TIMES DAILY AS NEEDED) 90 tablet 0  . amLODipine (NORVASC) 10 MG tablet Take 1 tablet (10 mg total) by mouth every morning. 90 tablet 3  . atorvastatin (LIPITOR) 40 MG tablet Take 40 mg by mouth every morning.    . Calcium Carb-Cholecalciferol (CALCIUM 600 + D PO) Take 1 tablet by mouth daily.    . CHANTIX 1 MG tablet TAKE ONE TABLET TWICE DAILY 56 tablet 3  . esomeprazole (NEXIUM) 40 MG capsule Take 1 capsule (40 mg total) by mouth daily. 30 capsule 3  . HYDROcodone-acetaminophen (VICODIN) 5-500 MG per tablet Take 1 tablet by mouth every 6 (six) hours as needed. Reported on 09/26/2015    . metaxalone (SKELAXIN) 800 MG tablet daily as needed. Take one by mouth at bedtime    . metFORMIN (GLUCOPHAGE) 500 MG tablet TAKE ONE TABLET TWICE DAILY WITH MEALS 180 tablet 0  . Multiple Vitamin (MULTIVITAMIN) tablet Take 1 tablet by mouth daily.    . valsartan (DIOVAN) 160 MG tablet Take 1 tablet (160 mg total) by mouth every morning. 90 tablet 3  . vitamin C (ASCORBIC ACID) 500 MG tablet Take 500 mg by mouth daily.    . Zinc 50 MG TABS Take 1 tablet by mouth daily.    Marland Kitchen zolpidem (AMBIEN CR) 6.25 MG CR tablet TAKE 1 TO 2 TABLETS AT BEDTIME AS NEEDED 60 tablet 0   No current facility-administered medications for this visit.    Review of Systems:  GENERAL:  Feels good.  Active.  No fevers, sweats or weight loss. PERFORMANCE STATUS (ECOG):  0 HEENT:  No visual changes, runny nose, sore throat, mouth sores or tenderness. Lungs: No shortness of breath or cough.  No hemoptysis. Cardiac:  No chest pain, palpitations, orthopnea, or PND. GI:  No nausea, vomiting, diarrhea, constipation, melena or hematochezia. GU:  No urgency, frequency, dysuria, or hematuria. Musculoskeletal:  No back pain.  No joint pain.  No  muscle tenderness. Extremities:  Tender under left arm s/p axillary surgical exploration. No swelling. Skin:  No rashes or skin changes. Neuro:  No headache, numbness or weakness, balance or coordination issues. Endocrine:  Diabetes.  No thyroid issues, hot flashes or night sweats. Psych:  No mood changes, depression or anxiety. Pain:  No focal pain. Review of systems:  All other systems reviewed and found to be negative.  Physical Exam: Blood pressure 135/82, pulse 99, temperature 98.1 F (36.7 C), temperature source Tympanic, resp. rate 18, height _0  (1.702 m), weight 213 lb 1.2 oz (96.65 kg). GENERAL:  Well developed, well nourished, sitting comfortably in the exam room in no  acute distress. MENTAL STATUS:  Alert and oriented to person, place and time. HEAD:  Short styled dark hair with highlights.  Normocephalic, atraumatic, face symmetric, no Cushingoid features. EYES:  Pupils equal round and reactive to light and accomodation.  No conjunctivitis or scleral icterus. ENT:  Oropharynx clear without lesion.  Tongue normal. Mucous membranes moist.  RESPIRATORY:  Clear to auscultation without rales, wheezes or rhonchi. CARDIOVASCULAR:  Regular rate and rhythm without murmur, rub or gallop. BREAST:  Right breast without masses, skin changes or nipple discharge.  Left breast s/p wide excision with post operative changes in the upper outer quadrant.  No skin changes or nipple discharge.  ABDOMEN:  Soft, non-tender, with active bowel sounds, and no appreciable hepatosplenomegaly.  No masses. SKIN:  No rashes, ulcers or lesions. EXTREMITIES: No edema, no skin discoloration or tenderness.  No palpable cords. LYMPH NODES: No palpable cervical, supraclavicular, axillary or inguinal adenopathy  NEUROLOGICAL: Unremarkable. PSYCH:  Appropriate.  Office Visit on 10/01/2015  Component Date Value Ref Range Status  . Hgb A1c MFr Bld 10/01/2015 9.0* 4.6 - 6.5 % Final   Glycemic Control Guidelines  for People with Diabetes:Non Diabetic:  <6%Goal of Therapy: <7%Additional Action Suggested:  >8%   . Sodium 10/01/2015 137  135 - 145 mEq/L Final  . Potassium 10/01/2015 3.9  3.5 - 5.1 mEq/L Final  . Chloride 10/01/2015 98  96 - 112 mEq/L Final  . CO2 10/01/2015 28  19 - 32 mEq/L Final  . Glucose, Bld 10/01/2015 205* 70 - 99 mg/dL Final  . BUN 10/01/2015 8  6 - 23 mg/dL Final  . Creatinine, Ser 10/01/2015 0.61  0.40 - 1.20 mg/dL Final  . Total Bilirubin 10/01/2015 0.5  0.2 - 1.2 mg/dL Final  . Alkaline Phosphatase 10/01/2015 87  39 - 117 U/L Final  . AST 10/01/2015 109* 0 - 37 U/L Final  . ALT 10/01/2015 79* 0 - 35 U/L Final  . Total Protein 10/01/2015 7.6  6.0 - 8.3 g/dL Final  . Albumin 10/01/2015 4.3  3.5 - 5.2 g/dL Final  . Calcium 10/01/2015 9.5  8.4 - 10.5 mg/dL Final  . GFR 10/01/2015 106.50  >60.00 mL/min Final  . Cholesterol 10/01/2015 138  0 - 200 mg/dL Final   ATP III Classification       Desirable:  < 200 mg/dL               Borderline High:  200 - 239 mg/dL          High:  > = 240 mg/dL  . Triglycerides 10/01/2015 210.0* 0.0 - 149.0 mg/dL Final   Normal:  <150 mg/dLBorderline High:  150 - 199 mg/dL  . HDL 10/01/2015 37.70* >39.00 mg/dL Final  . VLDL 10/01/2015 42.0* 0.0 - 40.0 mg/dL Final  . Total CHOL/HDL Ratio 10/01/2015 4   Final                  Men          Women1/2 Average Risk     3.4          3.3Average Risk          5.0          4.42X Average Risk          9.6          7.13X Average Risk          15.0          11.0                      .  NonHDL 10/01/2015 100.40   Final   NOTE:  Non-HDL goal should be 30 mg/dL higher than patient's LDL goal (i.e. LDL goal of < 70 mg/dL, would have non-HDL goal of < 100 mg/dL)  . Direct LDL 10/01/2015 83.0   Final   Optimal:  <100 mg/dLNear or Above Optimal:  100-129 mg/dLBorderline High:  130-159 mg/dLHigh:  160-189 mg/dLVery High:  >190 mg/dL    Assessment:  Connie Maldonado is a 59 y.o. female with stage IA left breast cancer s/p  wide excision and sentinel lymph node exploration.  Pathology revealed a 4 mm  grade I invasive mammary carcinoma.  Margins were negative.  No sentinel lymph node was retrieved.  Tumor was ER > 90%, PR > 90%, and Her2/neu 1+.   Pathologic stage was T1aNxMx.  Bone density study on 07/25/2010 was normal with a T-score of -0.38 in L1 - L4 and a T-score of -0.48 in the left proximal femur.  Symptomatically, she is doing well.  She denies any complaints except for left axillary and breast tenderness.  Exam reveals post-operative changes.  Plan: 1.  Discuss diagnosis, staging, and management of breast cancer.  Discuss small (< 5 mm), low grade, hormone receptor positive breast cancer.  Discuss no indication for Mammoprint.  Discuss unknown lymph node status, but suspect negative.  Discuss plan for radiation (external beam versus Mammosite) followed by 5 years of hormonal therapy.  Discuss side effects of aromatase inhibitors (vasomotor symptoms, joint aches, bone loss).  Discuss baseline bone density study.  Discuss follow-up in clinic after completion of radiation. 2.  Follow-up with radiation oncology tomorrow. 3.  Schedule bone density study. 4.  Information on Femara today. 5.  RTC after completion of radiation for MD assessment, labs (CBC with diff, CMP, CA27.29), and initiation of Femara.   Lequita Asal, MD  10/03/2015, 11:49 AM

## 2015-10-04 ENCOUNTER — Telehealth: Payer: Self-pay | Admitting: *Deleted

## 2015-10-04 ENCOUNTER — Telehealth: Payer: Self-pay | Admitting: Family Medicine

## 2015-10-04 ENCOUNTER — Ambulatory Visit
Admission: RE | Admit: 2015-10-04 | Discharge: 2015-10-04 | Disposition: A | Discharge status: Home / Self Care | Payer: BLUE CROSS/BLUE SHIELD | Source: Ambulatory Visit | Attending: Radiation Oncology | Admitting: Radiation Oncology

## 2015-10-04 ENCOUNTER — Encounter: Payer: Self-pay | Admitting: Radiation Oncology

## 2015-10-04 VITALS — BP 163/96 | HR 103 | Temp 98.7°F | Resp 20 | Wt 212.1 lb

## 2015-10-04 DIAGNOSIS — C50412 Malignant neoplasm of upper-outer quadrant of left female breast: Secondary | ICD-10-CM | POA: Insufficient documentation

## 2015-10-04 DIAGNOSIS — I10 Essential (primary) hypertension: Secondary | ICD-10-CM | POA: Insufficient documentation

## 2015-10-04 DIAGNOSIS — D649 Anemia, unspecified: Secondary | ICD-10-CM | POA: Insufficient documentation

## 2015-10-04 DIAGNOSIS — F419 Anxiety disorder, unspecified: Secondary | ICD-10-CM | POA: Insufficient documentation

## 2015-10-04 DIAGNOSIS — E119 Type 2 diabetes mellitus without complications: Secondary | ICD-10-CM | POA: Insufficient documentation

## 2015-10-04 DIAGNOSIS — Z7984 Long term (current) use of oral hypoglycemic drugs: Secondary | ICD-10-CM | POA: Insufficient documentation

## 2015-10-04 DIAGNOSIS — G4730 Sleep apnea, unspecified: Secondary | ICD-10-CM | POA: Insufficient documentation

## 2015-10-04 DIAGNOSIS — Z87891 Personal history of nicotine dependence: Secondary | ICD-10-CM | POA: Insufficient documentation

## 2015-10-04 DIAGNOSIS — Z510 Encounter for antineoplastic radiation therapy: Secondary | ICD-10-CM | POA: Insufficient documentation

## 2015-10-04 DIAGNOSIS — K219 Gastro-esophageal reflux disease without esophagitis: Secondary | ICD-10-CM | POA: Insufficient documentation

## 2015-10-04 DIAGNOSIS — Z79899 Other long term (current) drug therapy: Secondary | ICD-10-CM | POA: Insufficient documentation

## 2015-10-04 DIAGNOSIS — E785 Hyperlipidemia, unspecified: Secondary | ICD-10-CM | POA: Insufficient documentation

## 2015-10-04 DIAGNOSIS — M545 Low back pain: Secondary | ICD-10-CM | POA: Insufficient documentation

## 2015-10-04 DIAGNOSIS — Z800 Family history of malignant neoplasm of digestive organs: Secondary | ICD-10-CM | POA: Insufficient documentation

## 2015-10-04 DIAGNOSIS — Z170 Estrogen receptor positive status [ER+]: Secondary | ICD-10-CM | POA: Insufficient documentation

## 2015-10-04 MED ORDER — CEFADROXIL 500 MG PO CAPS: 500.0000 mg | ORAL_CAPSULE | Freq: Two times a day (BID) | ORAL | Status: DC

## 2015-10-04 NOTE — Telephone Encounter (Signed)
See results

## 2015-10-04 NOTE — Consult Note (Signed)
Except an outstanding is perfect of Radiation Oncology NEW PATIENT EVALUATION  Name: Connie Maldonado  MRN: [id number]  Date:   10/04/2015     DOB: 1956/06/28   This 60 y.o. female patient presents to the clinic for initial evaluation of stage I invasive mammary carcinoma. (T1 a N0 M0) ER/PR positive HER-2/neu negative status post wide local excision.  REFERRING PHYSICIAN: Kirk Ruths, MD  CHIEF COMPLAINT:  Chief Complaint  Patient presents with  . Breast Cancer    Pt is here for initial consultation of breast cancer.       DIAGNOSIS: The encounter diagnosis was Malignant neoplasm of upper-outer quadrant of left female breast ([place]).   PREVIOUS INVESTIGATIONS:  Mammograms ultrasound reviewed Clinical notes reviewed Pathology report reviewed  HPI: Patient is a pleasant 60 year old female presented with an abnormal mammogram of the left breast showing an 8 mm area of architectural distortion in the 3:00 position suspicious for malignancy. She underwent ultrasound-guided biopsy which was positive for ER/PR positive HER-2/neu negative invasive mammary carcinoma. She then underwent a wide local excision. Attempted sentinel node biopsy unfortunately revealed no evidence of lymphoid tissue. She had a 0.4 cm grade 1 invasive mammary carcinoma with margins clear at 1 cm. Surgeon's evaluated her lumpectomy cavity and believe she would be a good candidate for MammoSite balloon placement. She's currently doing well specifically denies breast tenderness cough or bone pain. She's been seen by medical oncology declined for systemic chemotherapy will be a candidate for aromatase inhibitor therapy after completion of radiation.  PLANNED TREATMENT REGIMEN: Accelerated partial breast irradiation  PAST MEDICAL HISTORY:  has a past medical history of GERD (gastroesophageal reflux disease); Hyperlipidemia; Hypertension; Anxiety; Diabetes mellitus without complication ([place]); Cancer ([place]); Sleep apnea;  Lumbar pain; and Anemia.    PAST SURGICAL HISTORY:  Past Surgical History  Procedure Laterality Date  . Tubal ligation    . [place]  [place]    6 mm hyperplastic polyp, internal hemorrhoids  . Esophagogastroduodenoscopy  [place] ([place]    reflux esophagitis, gastritis  . Dilation and curettage of uterus      X 2  . Tonsillectomy      AGE 32  . Breast lumpectomy with sentinel lymph node biopsy Left 09/19/2015    Procedure: BREAST LUMPECTOMY WITH SENTINEL LYMPH NODE BX;  Surgeon: Robert Bellow, MD;  Location: [facility] ORS;  Service: General;  Laterality: Left;  . Axillary lymph node biopsy Left 09/19/2015    Procedure: AXILLARY LYMPH NODE BIOPSY;  Surgeon: Robert Bellow, MD;  Location: [facility] ORS;  Service: General;  Laterality: Left;    FAMILY HISTORY: family history includes Cancer in her father; Colon cancer in her father; Heart disease in her mother; Hypertension in her mother; Stomach cancer (age of onset: 62) in her father.  SOCIAL HISTORY:  reports that she has quit smoking. Her smoking use included Cigarettes. She has never used smokeless tobacco. She reports that she drinks about 1.0 oz of alcohol per week. She reports that she does not use illicit drugs.  ALLERGIES: Review of patient's allergies indicates no known allergies.  MEDICATIONS:  Current Outpatient Prescriptions  Medication Sig Dispense Refill  . ALPRAZolam (XANAX) 0.25 MG tablet TAKE ONE TABLET BY MOUTH 3 TIMES DAILY AS NEEDED (Patient taking differently: as needed. TAKE ONE TABLET BY MOUTH 3 TIMES DAILY AS NEEDED) 90 tablet 0  . amLODipine (NORVASC) 10 MG tablet Take 1 tablet (10 mg total) by mouth every morning. 90 tablet 3  . atorvastatin (LIPITOR)  40 MG tablet Take 40 mg by mouth every morning.    . Calcium Carb-Cholecalciferol (CALCIUM 600 + D PO) Take 1 tablet by mouth daily.    . CHANTIX 1 MG tablet TAKE ONE TABLET TWICE DAILY 56 tablet 3  . esomeprazole (NEXIUM) 40 MG capsule Take 1 capsule (40  mg total) by mouth daily. 30 capsule 3  . HYDROcodone-acetaminophen (VICODIN) 5-500 MG per tablet Take 1 tablet by mouth every 6 (six) hours as needed. Reported on 09/26/2015    . metaxalone (SKELAXIN) 800 MG tablet daily as needed. Take one by mouth at bedtime    . metFORMIN (GLUCOPHAGE) 500 MG tablet TAKE ONE TABLET TWICE DAILY WITH MEALS 180 tablet 0  . Multiple Vitamin (MULTIVITAMIN) tablet Take 1 tablet by mouth daily.    . valsartan (DIOVAN) 160 MG tablet Take 1 tablet (160 mg total) by mouth every morning. 90 tablet 3  . vitamin C (ASCORBIC ACID) 500 MG tablet Take 500 mg by mouth daily.    . Zinc 50 MG TABS Take 1 tablet by mouth daily.    Marland Kitchen zolpidem (AMBIEN CR) 6.25 MG CR tablet TAKE 1 TO 2 TABLETS AT BEDTIME AS NEEDED 60 tablet 0   No current facility-administered medications for this encounter.    ECOG PERFORMANCE STATUS:  0 - Asymptomatic  REVIEW OF SYSTEMS:  Patient denies any weight loss, fatigue, weakness, fever, chills or night sweats. Patient denies any loss of vision, blurred vision. Patient denies any ringing  of the ears or hearing loss. No irregular heartbeat. Patient denies heart murmur or history of fainting. Patient denies any chest pain or pain radiating to her upper extremities. Patient denies any shortness of breath, difficulty breathing at night, cough or hemoptysis. Patient denies any swelling in the lower legs. Patient denies any nausea vomiting, vomiting of blood, or coffee ground material in the vomitus. Patient denies any stomach pain. Patient states has had normal bowel movements no significant constipation or diarrhea. Patient denies any dysuria, hematuria or significant nocturia. Patient denies any problems walking, swelling in the joints or loss of balance. Patient denies any skin changes, loss of hair or loss of weight. Patient denies any excessive worrying or anxiety or significant depression. Patient denies any problems with insomnia. Patient denies excessive  thirst, polyuria, polydipsia. Patient denies any swollen glands, patient denies easy bruising or easy bleeding. Patient denies any recent infections, allergies or URI. Patient "s visual fields have not changed significantly in recent time.    PHYSICAL EXAM: BP 163/96 mmHg  Pulse 103  Temp(Src) 98.7 F (37.1 C)  Resp 20  Wt 212 lb 1.3 oz (96.2 kg) She status post wide local excision of the left breast. Incision is well-healed. No dominant mass or nodularity is noted in either breast in 2 positions examined. No axillary or supraclavicular adenopathy is appreciated. Well-developed well-nourished patient in NAD. HEENT reveals PERLA, EOMI, discs not visualized.  Oral cavity is clear. No oral mucosal lesions are identified. Neck is clear without evidence of cervical or supraclavicular adenopathy. Lungs are clear to A&P. Cardiac examination is essentially unremarkable with regular rate and rhythm without murmur rub or thrill. Abdomen is benign with no organomegaly or masses noted. Motor sensory and DTR levels are equal and symmetric in the upper and lower extremities. Cranial nerves II through XII are grossly intact. Proprioception is intact. No peripheral adenopathy or edema is identified. No motor or sensory levels are noted. Crude visual fields are within normal range.  LABORATORY DATA:  Pathology reports reviewed    RADIOLOGY RESULTS: Mammograms ultrasound reviewed   IMPRESSION: Stage I invasive mammary carcinoma the left breast status post wide local excision in 60 year old female tumor is ER/PR positive HER-2/neu not overexpressed.  PLAN: At this time I got over treatment options for adjuvant treatment for breast cancer. We discussed in detail whole breast radiation as well as accelerated partial breast irradiation. Patient favors the accelerated course of treatment. I've also done a [employer] nomogram on potential for lymph node involvement based on the 1a breast cancer grade 1  which shows extremely small odds would be any potential for sentinel node involvement. I believe we can ignore her lymph node status of the axilla this time and proceed with accelerated partial breast irradiation. We have coordinated with surgeon's office for balloon placement will do BrachyVision treatment planning shortly after balloon is placed. Would plan on delivering 3400 cGy in 10 fractions at 340 cGy twice a day. Risks and benefits of treatment including increased scarring in the lumpectomy site, possible posted stamp area of skin reaction, fatigue, and associated Location is a balloon catheter placement all were discussed in detail with the patient. Patient seems to comprehend my treatment plan well.  I would like to take this opportunity for allowing me to participate in the care of your patient.Armstead Peaks., MD

## 2015-10-04 NOTE — Telephone Encounter (Signed)
Mammosite schedule reviewed with the patient Placement   10-15-15 8:30 at [facility] Scan 10-18-15 Treat 4-3 to 4-7 Aware of ATB and directions reviewed. Aware no showers and to wear her bra while mammosite in place. Pt agrees.

## 2015-10-04 NOTE — Telephone Encounter (Signed)
Pt returned your call re: lab results. She request call back after 1 pm.

## 2015-10-08 ENCOUNTER — Telehealth: Payer: Self-pay | Admitting: *Deleted

## 2015-10-08 NOTE — Telephone Encounter (Signed)
Patient called stating she was supposed to sign a consent form for taking a xanax on the morning of her mammosite procedure. She was confused about what she needed to do. She said she spoke with you about this last week. Please advise.

## 2015-10-09 ENCOUNTER — Ambulatory Visit: Payer: BLUE CROSS/BLUE SHIELD | Admitting: General Surgery

## 2015-10-09 NOTE — Telephone Encounter (Signed)
she will plan on coming by to sign consent.

## 2015-10-11 ENCOUNTER — Ambulatory Visit: Payer: BLUE CROSS/BLUE SHIELD | Admitting: General Surgery

## 2015-10-15 ENCOUNTER — Ambulatory Visit: Payer: BLUE CROSS/BLUE SHIELD | Admitting: General Surgery

## 2015-10-15 ENCOUNTER — Other Ambulatory Visit (INDEPENDENT_AMBULATORY_CARE_PROVIDER_SITE_OTHER): Payer: BLUE CROSS/BLUE SHIELD

## 2015-10-15 ENCOUNTER — Encounter: Payer: Self-pay | Admitting: General Surgery

## 2015-10-15 VITALS — BP 152/72 | HR 88 | Resp 12 | Ht 67.0 in | Wt 210.0 lb

## 2015-10-15 DIAGNOSIS — C50912 Malignant neoplasm of unspecified site of left female breast: Secondary | ICD-10-CM | POA: Diagnosis not present

## 2015-10-15 NOTE — Progress Notes (Signed)
Patient ID: Connie Maldonado, female   DOB: 05/03/1956, 60 y.o.   MRN: [id number]  Chief Complaint  Patient presents with  . Procedure    mammosite    HPI Connie Maldonado is a 60 y.o. female here today for a left mammosite placement.  HPI  Past Medical History  Diagnosis Date  . GERD (gastroesophageal reflux disease)   . Hyperlipidemia   . Hypertension   . Anxiety   . Diabetes mellitus without complication ([place])   . Cancer ([place])   . Sleep apnea     + SLEEP STUDY 2 YEARS AGO BUT PT STATES NO ONE EVER HELPED HER GET A CPAP MACHINE   . Lumbar pain   . Anemia     Past Surgical History  Procedure Laterality Date  . Tubal ligation    . [place]  [place]    6 mm hyperplastic polyp, internal hemorrhoids  . Esophagogastroduodenoscopy  [place]/2008 ([place]    reflux esophagitis, gastritis  . Dilation and curettage of uterus      X 2  . Tonsillectomy      AGE 77  . Breast lumpectomy with sentinel lymph node biopsy Left 09/19/2015    Procedure: BREAST LUMPECTOMY WITH SENTINEL LYMPH NODE BX;  Surgeon: Robert Bellow, MD;  Location: [facility] ORS;  Service: General;  Laterality: Left;  . Axillary lymph node biopsy Left 09/19/2015    Procedure: AXILLARY LYMPH NODE BIOPSY;  Surgeon: Robert Bellow, MD;  Location: [facility] ORS;  Service: General;  Laterality: Left;    Family History  Problem Relation Age of Onset  . Heart disease Mother   . Hypertension Mother   . Cancer Father     stomach  . Stomach cancer Father 73  . Colon cancer Father     Social History Social History  Substance Use Topics  . Smoking status: Former Smoker    Types: Cigarettes  . Smokeless tobacco: Never Used     Comment: PT SMOKES 1 CIG MAYBE EVERY WEEK WITH THE HELP OF CHANTIX BUT PRIOR TO THAT PT WAS SMOKING 1PPD  . Alcohol Use: 1.0 oz/week    2 Standard drinks or equivalent per week     Comment: OCC    No Known Allergies  Current Outpatient Prescriptions  Medication Sig Dispense Refill  .  ALPRAZolam (XANAX) 0.25 MG tablet TAKE ONE TABLET BY MOUTH 3 TIMES DAILY AS NEEDED (Patient taking differently: as needed. TAKE ONE TABLET BY MOUTH 3 TIMES DAILY AS NEEDED) 90 tablet 0  . amLODipine (NORVASC) 10 MG tablet Take 1 tablet (10 mg total) by mouth every morning. 90 tablet 3  . atorvastatin (LIPITOR) 40 MG tablet Take 40 mg by mouth every morning.    . Calcium Carb-Cholecalciferol (CALCIUM 600 + D PO) Take 1 tablet by mouth daily.    . cefadroxil (DURICEF) 500 MG capsule Take 1 capsule (500 mg total) by mouth 2 (two) times daily. Start one hour before office procedure on 10-15-15 24 capsule 0  . CHANTIX 1 MG tablet TAKE ONE TABLET TWICE DAILY 56 tablet 3  . esomeprazole (NEXIUM) 40 MG capsule Take 1 capsule (40 mg total) by mouth daily. 30 capsule 3  . HYDROcodone-acetaminophen (VICODIN) 5-500 MG per tablet Take 1 tablet by mouth every 6 (six) hours as needed. Reported on 09/26/2015    . metaxalone (SKELAXIN) 800 MG tablet daily as needed. Take one by mouth at bedtime    . metFORMIN (GLUCOPHAGE) 500 MG tablet TAKE ONE TABLET TWICE DAILY  WITH MEALS 180 tablet 0  . Multiple Vitamin (MULTIVITAMIN) tablet Take 1 tablet by mouth daily.    . valsartan (DIOVAN) 160 MG tablet Take 1 tablet (160 mg total) by mouth every morning. 90 tablet 3  . vitamin C (ASCORBIC ACID) 500 MG tablet Take 500 mg by mouth daily.    . Zinc 50 MG TABS Take 1 tablet by mouth daily.    Marland Kitchen zolpidem (AMBIEN CR) 6.25 MG CR tablet TAKE 1 TO 2 TABLETS AT BEDTIME AS NEEDED 60 tablet 0   No current facility-administered medications for this visit.    Review of Systems Review of Systems  Blood pressure 152/72, pulse 88, resp. rate 12, height 5\' 7"  (1.702 m), weight 210 lb (95.255 kg).  Physical Exam Physical Exam  Pulmonary/Chest:      Data Reviewed Ultrasound examination showed a small fluid collection as previously identified in the upper-outer quadrant of the left breast at the site of her previous wide  excision.  The patient has been evaluation by [facility] oncology.   Assessment    Candidate for accelerated partial breast radiation.    Plan    The procedure was reviewed. 10 mL of 0.5% Xylocaine with 0.25% Marcaine with 1-200,000 of epinephrine was utilized well tolerated. ChloraPrep was applied to the skin. An 8 mm trocar was placed into the biopsy cavity. The cavity evaluation device was gradually inflated with sterile saline. The patient experienced burning discomfort at the 15 mL volume fill which persisted through 35 mL filling. There appeared to be adequate spacing, and as the burning sensation abated it was elected to place the treatment balloon. This was also filled with 35 mL of mixture of Omnipaque and saline. The discomfort with balloon inflation past more rapidly than it had with the cavity evaluation device. It was repositioned to provide the best exposure to the original wide excision cavity. It sits just slightly medial in the cavity which is completely effaced by the balloon. Pre-inflation assessment of the balloon show them to be spherical.  The minimal distance between the balloon and the skin was 1.6 cm. No evidence of loculated fluid or air visible above the level of balloon.  Bacitracin ointment was applied to the exit site. Dry dressing applied. The patient will make use of Tylenol or nonsteroidals for comfort.  She'll undergo imaging on Wednesday, March 29 and begin treatment on April 3. We'll plan for follow-up the week of April 10.    PCP:  Arnette Norris  This information has been scribed by Gaspar Cola CMA.    Robert Bellow 10/15/2015, 12:45 PM

## 2015-10-15 NOTE — Patient Instructions (Signed)
Patient care kit given to patient.  Instructed no showers, sponge bath while mammosite in place, take antibiotic. Follow up with Cancer Center as arranged. Discussed wearing your bra for support at all times. 

## 2015-10-17 ENCOUNTER — Ambulatory Visit
Admission: RE | Admit: 2015-10-17 | Discharge: 2015-10-17 | Disposition: A | Discharge status: Home / Self Care | Payer: BLUE CROSS/BLUE SHIELD | Source: Ambulatory Visit | Attending: Hematology and Oncology | Admitting: Hematology and Oncology

## 2015-10-17 DIAGNOSIS — Z780 Asymptomatic menopausal state: Secondary | ICD-10-CM | POA: Insufficient documentation

## 2015-10-17 DIAGNOSIS — M8580 Other specified disorders of bone density and structure, unspecified site: Secondary | ICD-10-CM | POA: Insufficient documentation

## 2015-10-17 DIAGNOSIS — C50412 Malignant neoplasm of upper-outer quadrant of left female breast: Secondary | ICD-10-CM | POA: Diagnosis not present

## 2015-10-18 ENCOUNTER — Ambulatory Visit
Admission: RE | Admit: 2015-10-18 | Discharge: 2015-10-18 | Disposition: A | Discharge status: Home / Self Care | Payer: BLUE CROSS/BLUE SHIELD | Source: Ambulatory Visit | Attending: Radiation Oncology | Admitting: Radiation Oncology

## 2015-10-18 ENCOUNTER — Ambulatory Visit: Payer: BLUE CROSS/BLUE SHIELD

## 2015-10-18 ENCOUNTER — Inpatient Hospital Stay
Admission: RE | Admit: 2015-10-18 | Discharge: 2015-10-18 | Disposition: A | Discharge status: Home / Self Care | Payer: Self-pay | Source: Ambulatory Visit | Attending: Radiation Oncology | Admitting: Radiation Oncology

## 2015-10-18 DIAGNOSIS — C50412 Malignant neoplasm of upper-outer quadrant of left female breast: Secondary | ICD-10-CM | POA: Diagnosis present

## 2015-10-18 DIAGNOSIS — Z87891 Personal history of nicotine dependence: Secondary | ICD-10-CM | POA: Diagnosis not present

## 2015-10-18 DIAGNOSIS — Z510 Encounter for antineoplastic radiation therapy: Secondary | ICD-10-CM | POA: Diagnosis not present

## 2015-10-18 DIAGNOSIS — M545 Low back pain: Secondary | ICD-10-CM | POA: Diagnosis not present

## 2015-10-18 DIAGNOSIS — E119 Type 2 diabetes mellitus without complications: Secondary | ICD-10-CM | POA: Diagnosis not present

## 2015-10-18 DIAGNOSIS — D649 Anemia, unspecified: Secondary | ICD-10-CM | POA: Diagnosis not present

## 2015-10-18 DIAGNOSIS — F419 Anxiety disorder, unspecified: Secondary | ICD-10-CM | POA: Diagnosis not present

## 2015-10-18 DIAGNOSIS — I10 Essential (primary) hypertension: Secondary | ICD-10-CM | POA: Diagnosis not present

## 2015-10-18 DIAGNOSIS — Z800 Family history of malignant neoplasm of digestive organs: Secondary | ICD-10-CM | POA: Diagnosis not present

## 2015-10-18 DIAGNOSIS — Z79899 Other long term (current) drug therapy: Secondary | ICD-10-CM | POA: Diagnosis not present

## 2015-10-18 DIAGNOSIS — G4730 Sleep apnea, unspecified: Secondary | ICD-10-CM | POA: Diagnosis not present

## 2015-10-18 DIAGNOSIS — K219 Gastro-esophageal reflux disease without esophagitis: Secondary | ICD-10-CM | POA: Diagnosis not present

## 2015-10-18 DIAGNOSIS — E785 Hyperlipidemia, unspecified: Secondary | ICD-10-CM | POA: Diagnosis not present

## 2015-10-18 DIAGNOSIS — Z7984 Long term (current) use of oral hypoglycemic drugs: Secondary | ICD-10-CM | POA: Diagnosis not present

## 2015-10-18 DIAGNOSIS — Z170 Estrogen receptor positive status [ER+]: Secondary | ICD-10-CM | POA: Diagnosis not present

## 2015-10-22 ENCOUNTER — Ambulatory Visit
Admission: RE | Admit: 2015-10-22 | Discharge: 2015-10-22 | Disposition: A | Discharge status: Home / Self Care | Payer: BLUE CROSS/BLUE SHIELD | Source: Ambulatory Visit | Attending: Radiation Oncology | Admitting: Radiation Oncology

## 2015-10-22 DIAGNOSIS — C50412 Malignant neoplasm of upper-outer quadrant of left female breast: Secondary | ICD-10-CM | POA: Diagnosis not present

## 2015-10-23 ENCOUNTER — Ambulatory Visit
Admission: RE | Admit: 2015-10-23 | Discharge: 2015-10-23 | Disposition: A | Discharge status: Home / Self Care | Payer: BLUE CROSS/BLUE SHIELD | Source: Ambulatory Visit | Attending: Radiation Oncology | Admitting: Radiation Oncology

## 2015-10-23 DIAGNOSIS — C50412 Malignant neoplasm of upper-outer quadrant of left female breast: Secondary | ICD-10-CM | POA: Diagnosis not present

## 2015-10-24 ENCOUNTER — Ambulatory Visit
Admission: RE | Admit: 2015-10-24 | Discharge: 2015-10-24 | Disposition: A | Discharge status: Home / Self Care | Payer: BLUE CROSS/BLUE SHIELD | Source: Ambulatory Visit | Attending: Radiation Oncology | Admitting: Radiation Oncology

## 2015-10-24 DIAGNOSIS — C50412 Malignant neoplasm of upper-outer quadrant of left female breast: Secondary | ICD-10-CM | POA: Diagnosis not present

## 2015-10-25 ENCOUNTER — Ambulatory Visit
Admission: RE | Admit: 2015-10-25 | Discharge: 2015-10-25 | Disposition: A | Discharge status: Home / Self Care | Payer: BLUE CROSS/BLUE SHIELD | Source: Ambulatory Visit | Attending: Radiation Oncology | Admitting: Radiation Oncology

## 2015-10-25 DIAGNOSIS — C50412 Malignant neoplasm of upper-outer quadrant of left female breast: Secondary | ICD-10-CM | POA: Diagnosis not present

## 2015-10-26 ENCOUNTER — Ambulatory Visit
Admission: RE | Admit: 2015-10-26 | Discharge: 2015-10-26 | Disposition: A | Discharge status: Home / Self Care | Payer: BLUE CROSS/BLUE SHIELD | Source: Ambulatory Visit | Attending: Radiation Oncology | Admitting: Radiation Oncology

## 2015-10-26 DIAGNOSIS — C50412 Malignant neoplasm of upper-outer quadrant of left female breast: Secondary | ICD-10-CM | POA: Diagnosis not present

## 2015-10-30 ENCOUNTER — Ambulatory Visit: Payer: BLUE CROSS/BLUE SHIELD | Admitting: General Surgery

## 2015-11-01 ENCOUNTER — Encounter: Payer: Self-pay | Admitting: General Surgery

## 2015-11-01 ENCOUNTER — Ambulatory Visit (INDEPENDENT_AMBULATORY_CARE_PROVIDER_SITE_OTHER): Payer: BLUE CROSS/BLUE SHIELD | Admitting: General Surgery

## 2015-11-01 VITALS — BP 158/82 | HR 88 | Resp 14 | Ht 67.0 in | Wt 208.0 lb

## 2015-11-01 DIAGNOSIS — C50412 Malignant neoplasm of upper-outer quadrant of left female breast: Secondary | ICD-10-CM

## 2015-11-01 MED ORDER — FLUCONAZOLE 150 MG PO TABS: 150.0000 mg | ORAL_TABLET | ORAL | Status: DC

## 2015-11-01 NOTE — Patient Instructions (Addendum)
The patient is aware to call back for any questions or concerns. The patient has been asked to return to the office in one year with a bilateral diagnostic mammogram. 

## 2015-11-01 NOTE — Progress Notes (Signed)
Patient ID: Connie Maldonado, female   DOB: April 28, 1956, 60 y.o.   MRN: [id number]  Chief Complaint  Patient presents with  . Follow-up    HPI Connie Maldonado is a 60 y.o. female.  Here today for follow up left breast mammosite. States she is doing well.Tolerated accelerated breast radiation well. Pain present during MammoSite balloon placement lasted 24 hours, now with only mild soreness.    HPI  Past Medical History  Diagnosis Date  . GERD (gastroesophageal reflux disease)   . Hyperlipidemia   . Hypertension   . Anxiety   . Diabetes mellitus without complication (Connie Maldonado)   . Sleep apnea     + SLEEP STUDY 2 YEARS AGO BUT PT STATES NO ONE EVER HELPED HER GET A CPAP MACHINE   . Lumbar pain   . Anemia   . Cancer (Connie Maldonado)   . Breast cancer of upper-outer quadrant of left female breast (Connie Maldonado) 09/19/2015    4 mm, T1a, N0l ER positive, PR positive, HER-2/neu not amplified. Wide excision, MammoSite.    Past Surgical History  Procedure Laterality Date  . Tubal ligation    . [place]  [place]    6 mm hyperplastic polyp, internal hemorrhoids  . Esophagogast[place]oscopy  [place] ([place]    reflux esophagitis, gastritis  . Dilation and curettage of uterus      X 2  . Tonsillectomy      AGE 51  . Breast lumpectomy with sentinel lymph node biopsy Left 09/19/2015    Procedure: BREAST LUMPECTOMY WITH SENTINEL LYMPH NODE BX;  Surgeon: Connie Bellow, MD;  Location: [facility] OR[facility];  Service: General;  Laterality: Left;  . Axillary lymph node biopsy Left 09/19/2015    Procedure: AXILLARY LYMPH NODE BIOPSY;  Surgeon: Connie Bellow, MD;  Location: [facility];  Service: General;  Laterality: Left;    Family History  Problem Relation Age of Onset  . Heart disease Mother   . Hypertension Mother   . Cancer Father     stomach  . Stomach cancer Father 39  . Colon cancer Father     Social History Social History  Substance Use Topics  . Smoking status: Former Smoker    Types: Cigarettes  .  Smokeless tobacco: Never Used     Comment: PT SMOKES 1 CIG MAYBE EVERY WEEK WITH THE HELP OF CHANTIX BUT PRIOR TO THAT PT WAS SMOKING 1PPD  . Alcohol Use: 1.0 oz/week    2 Standard drinks or equivalent per week     Comment: OCC    No Known Allergies  Current Outpatient Prescriptions  Medication Sig Dispense Refill  . ALPRAZolam (XANAX) 0.25 MG tablet TAKE ONE TABLET BY MOUTH 3 TIMES DAILY AS NEEDED (Patient taking differently: as needed. TAKE ONE TABLET BY MOUTH 3 TIMES DAILY AS NEEDED) 90 tablet 0  . amLODipine (NORVASC) 10 MG tablet Take 1 tablet (10 mg total) by mouth every morning. 90 tablet 3  . atorvastatin (LIPITOR) 40 MG tablet Take 40 mg by mouth every morning.    . Calcium Carb-Cholecalciferol (CALCIUM 600 + D PO) Take 1 tablet by mouth daily.    . CHANTIX 1 MG tablet TAKE ONE TABLET TWICE DAILY 56 tablet 3  . esomeprazole (NEXIUM) 40 MG capsule Take 1 capsule (40 mg total) by mouth daily. 30 capsule 3  . HYDROcodone-acetaminophen (VICODIN) 5-500 MG per tablet Take 1 tablet by mouth every 6 (six) hours as needed. Reported on 09/26/2015    . metaxalone (SKELAXIN) 800 MG tablet  daily as needed. Take one by mouth at bedtime    . metFORMIN (GLUCOPHAGE) 500 MG tablet TAKE ONE TABLET TWICE DAILY WITH MEALS 180 tablet 0  . Multiple Vitamin (MULTIVITAMIN) tablet Take 1 tablet by mouth daily.    . valsartan (DIOVAN) 160 MG tablet Take 1 tablet (160 mg total) by mouth every morning. 90 tablet 3  . vitamin C (ASCORBIC ACID) 500 MG tablet Take 500 mg by mouth daily.    . Zinc 50 MG TABS Take 1 tablet by mouth daily.    Marland Kitchen zolpidem (AMBIEN CR) 6.25 MG CR tablet TAKE 1 TO 2 TABLETS AT BEDTIME AS NEEDED 60 tablet 0  . fluconazole (DIFLUCAN) 150 MG tablet Take 1 tablet (150 mg total) by mouth every 3 (three) days. 2 tablet 0   No current facility-administered medications for this visit.    Review of Systems Review of Systems  Constitutional: Negative.   Respiratory: Negative.    Cardiovascular: Negative.     Blood pressure 158/82, pulse 88, resp. rate 14, height '5\' 7"'$  (1.702 m), weight 208 lb (94.348 kg).  Physical Exam Physical Exam  Constitutional: She is oriented to person, place, and time. She appears well-developed and well-nourished.  HENT:  Mouth/Throat: Oropharynx is clear and moist.  Pulmonary/Chest:    5 cm area of faint erythremia along incision line left breast.  Neurological: She is alert and oriented to person, place, and time.  Skin: Skin is warm and dry.  Psychiatric: Her behavior is normal.       Assessment    Doing well status post axillary partial breast radiation.    Plan    The patient is scheduled to follow up with Connie Maldonado, M.D. next week to initiate Femara treatment.  We'll plan for bilateral mammograms in one year.  The patient is planning to move to [facility], and we'll try to schedule the mammograms at the [facility] the same day as her office visit to minimize travel time.    The patient has been asked to return to the office in one year with a bilateral diagnostic mammogram.    PCP:  Connie Maldonado  This information has been scribed by Karie Fetch RN, BSN,BC.    Connie Maldonado 11/01/2015, 9:21 AM

## 2015-11-08 ENCOUNTER — Telehealth: Payer: Self-pay | Admitting: Family Medicine

## 2015-11-08 ENCOUNTER — Inpatient Hospital Stay: Payer: BLUE CROSS/BLUE SHIELD | Attending: Hematology and Oncology | Admitting: Hematology and Oncology

## 2015-11-08 ENCOUNTER — Inpatient Hospital Stay: Payer: BLUE CROSS/BLUE SHIELD

## 2015-11-08 VITALS — BP 147/78 | HR 105 | Temp 98.4°F | Wt 209.3 lb

## 2015-11-08 DIAGNOSIS — G4730 Sleep apnea, unspecified: Secondary | ICD-10-CM | POA: Diagnosis not present

## 2015-11-08 DIAGNOSIS — E785 Hyperlipidemia, unspecified: Secondary | ICD-10-CM | POA: Insufficient documentation

## 2015-11-08 DIAGNOSIS — M8580 Other specified disorders of bone density and structure, unspecified site: Secondary | ICD-10-CM | POA: Diagnosis not present

## 2015-11-08 DIAGNOSIS — Z923 Personal history of irradiation: Secondary | ICD-10-CM | POA: Diagnosis not present

## 2015-11-08 DIAGNOSIS — Z79899 Other long term (current) drug therapy: Secondary | ICD-10-CM | POA: Insufficient documentation

## 2015-11-08 DIAGNOSIS — C50412 Malignant neoplasm of upper-outer quadrant of left female breast: Secondary | ICD-10-CM

## 2015-11-08 DIAGNOSIS — F419 Anxiety disorder, unspecified: Secondary | ICD-10-CM | POA: Diagnosis not present

## 2015-11-08 DIAGNOSIS — Z7984 Long term (current) use of oral hypoglycemic drugs: Secondary | ICD-10-CM | POA: Diagnosis not present

## 2015-11-08 DIAGNOSIS — I10 Essential (primary) hypertension: Secondary | ICD-10-CM | POA: Diagnosis not present

## 2015-11-08 DIAGNOSIS — R945 Abnormal results of liver function studies: Secondary | ICD-10-CM

## 2015-11-08 DIAGNOSIS — Z170 Estrogen receptor positive status [ER+]: Secondary | ICD-10-CM | POA: Diagnosis not present

## 2015-11-08 DIAGNOSIS — R7989 Other specified abnormal findings of blood chemistry: Secondary | ICD-10-CM

## 2015-11-08 DIAGNOSIS — E119 Type 2 diabetes mellitus without complications: Secondary | ICD-10-CM | POA: Diagnosis not present

## 2015-11-08 DIAGNOSIS — N951 Menopausal and female climacteric states: Secondary | ICD-10-CM | POA: Insufficient documentation

## 2015-11-08 DIAGNOSIS — Z87891 Personal history of nicotine dependence: Secondary | ICD-10-CM

## 2015-11-08 LAB — COMPREHENSIVE METABOLIC PANEL
ALT: 66 U/L — ABNORMAL HIGH (ref 14–54)
AST: 100 U/L — ABNORMAL HIGH (ref 15–41)
Albumin: 4.2 g/dL (ref 3.5–5.0)
Alkaline Phosphatase: 94 U/L (ref 38–126)
Anion gap: 7 (ref 5–15)
BUN: 9 mg/dL (ref 6–20)
CO2: 27 mmol/L (ref 22–32)
Calcium: 8.4 mg/dL — ABNORMAL LOW (ref 8.9–10.3)
Chloride: 102 mmol/L (ref 101–111)
Creatinine, Ser: 0.63 mg/dL (ref 0.44–1.00)
GFR calc Af Amer: >60 mL/min (ref >60)
GFR calc non Af Amer: >60 mL/min (ref >60)
Glucose, Bld: 140 mg/dL — ABNORMAL HIGH (ref 65–99)
Potassium: 3.6 mmol/L (ref 3.5–5.1)
Sodium: 136 mmol/L (ref 135–145)
Total Bilirubin: 0.5 mg/dL (ref 0.3–1.2)
Total Protein: 7.8 g/dL (ref 6.5–8.1)

## 2015-11-08 LAB — CBC WITH DIFFERENTIAL/PLATELET
Basophils Absolute: 0.1 10*3/uL (ref 0–0.1)
Basophils Relative: 1 %
Eosinophils Absolute: 0.3 10*3/uL (ref 0–0.7)
Eosinophils Relative: 2 %
HCT: 36 % (ref 35.0–47.0)
Hemoglobin: 11.9 g/dL — ABNORMAL LOW (ref 12.0–16.0)
Lymphocytes Relative: 21 %
Lymphs Abs: 2.6 10*3/uL (ref 1.0–3.6)
MCH: 27.5 pg (ref 26.0–34.0)
MCHC: 32.9 g/dL (ref 32.0–36.0)
MCV: 83.6 fL (ref 80.0–100.0)
Monocytes Absolute: 0.8 10*3/uL (ref 0.2–0.9)
Monocytes Relative: 7 %
Neutro Abs: 8.7 10*3/uL — ABNORMAL HIGH (ref 1.4–6.5)
Neutrophils Relative %: 69 %
Platelets: 349 10*3/uL (ref 150–440)
RBC: 4.31 MIL/uL (ref 3.80–5.20)
RDW: 16 % — ABNORMAL HIGH (ref 11.5–14.5)
WBC: 12.5 10*3/uL — ABNORMAL HIGH (ref 3.6–11.0)

## 2015-11-08 MED ORDER — LETROZOLE 2.5 MG PO TABS: 2.5000 mg | ORAL_TABLET | Freq: Every day | ORAL | Status: DC

## 2015-11-08 NOTE — Progress Notes (Signed)
[facility] day:  11/08/2015  Chief Complaint: Connie Maldonado is a 60 y.o. female with stage I left breast cancer who is seen for assessment after completion of radiation, review of interval bone density study, and initiation of hormonal therapy.  HPI:  The patient was last seen in the medical oncology on 10/03/2015.  At that time, she was seen for initial consultation for stage IA left breast cancer s/p wide excision and sentinel lymph node exploration on 09/19/2015.  Pathology revealed a 4 mm  grade I invasive mammary carcinoma.  Margins were negative.  No sentinel lymph node was retrieved.  Tumor was ER > 90%, PR > 90%, and Her2/neu 1+.  We discussed radiation followed by hormonal therapy.  A baseline bone density study was ordered.  She underwent Mammosite.  Radiation completed 10/26/2015.   She describes significant discomfort caused by the catheter.  Bone density study on 10/17/2015 revealed osteopenia with a T-score of -1.8 in the left femoral neck.  Symptomatically, she feels good.  She notes some hot flashes.    Past Medical History  Diagnosis Date  . GERD (gastroesophageal reflux disease)   . Hyperlipidemia   . Hypertension   . Anxiety   . Diabetes mellitus without complication ([place])   . Sleep apnea     + SLEEP STUDY 2 YEARS AGO BUT PT STATES NO ONE EVER HELPED HER GET A CPAP MACHINE   . Lumbar pain   . Anemia   . Cancer ([place])   . Breast cancer of upper-outer quadrant of left female breast ([place]) 09/19/2015    4 mm, T1a, N0l ER positive, PR positive, HER-2/neu not amplified. Wide excision, MammoSite.    Past Surgical History  Procedure Laterality Date  . Tubal ligation    . [place]  [place]    6 mm hyperplastic polyp, internal hemorrhoids  . Esophagogast[place]oscopy  [place]    reflux esophagitis, gastritis  . Dilation and curettage of uterus      X 2  . Tonsillectomy      AGE 1  . Breast lumpectomy  with sentinel lymph node biopsy Left 09/19/2015    Procedure: BREAST LUMPECTOMY WITH SENTINEL LYMPH NODE BX;  Surgeon: Robert Bellow, MD;  Location: [facility] ORS;  Service: General;  Laterality: Left;  . Axillary lymph node biopsy Left 09/19/2015    Procedure: AXILLARY LYMPH NODE BIOPSY;  Surgeon: Robert Bellow, MD;  Location: [facility] ORS;  Service: General;  Laterality: Left;    Family History  Problem Relation Age of Onset  . Heart disease Mother   . Hypertension Mother   . Cancer Father     stomach  . Stomach cancer Father 65  . Colon cancer Father     Social History:  reports that she has quit smoking. Her smoking use included Cigarettes. She has never used smokeless tobacco. She reports that she drinks about 1.0 oz of alcohol per week. She reports that she does not use illicit drugs.  She is from [place].  She moved to [place] in 2010.  She is in the process of moving to [place], [place], to be with her son and grand-daughter.  She plans on keeping her physicians in the [place] area despite her move.  The patient is alone today.  Allergies: No Known Allergies  Current Medications: Current Outpatient Prescriptions  Medication Sig Dispense Refill  . ALPRAZolam (XANAX) 0.25 MG tablet TAKE ONE TABLET BY MOUTH 3 TIMES  DAILY AS NEEDED (Patient taking differently: as needed. TAKE ONE TABLET BY MOUTH 3 TIMES DAILY AS NEEDED) 90 tablet 0  . amLODipine (NORVASC) 10 MG tablet Take 1 tablet (10 mg total) by mouth every morning. 90 tablet 3  . atorvastatin (LIPITOR) 40 MG tablet Take 40 mg by mouth every morning.    . Calcium Carb-Cholecalciferol (CALCIUM 600 + D PO) Take 1 tablet by mouth daily.    . CHANTIX 1 MG tablet TAKE ONE TABLET TWICE DAILY 56 tablet 3  . esomeprazole (NEXIUM) 40 MG capsule Take 1 capsule (40 mg total) by mouth daily. 30 capsule 3  . fluconazole (DIFLUCAN) 150 MG tablet Take 1 tablet (150 mg total) by mouth every 3 (three) days. 2 tablet 0  .  HYDROcodone-acetaminophen (VICODIN) 5-500 MG per tablet Take 1 tablet by mouth every 6 (six) hours as needed. Reported on 09/26/2015    . metaxalone (SKELAXIN) 800 MG tablet daily as needed. Take one by mouth at bedtime    . metFORMIN (GLUCOPHAGE) 500 MG tablet TAKE ONE TABLET TWICE DAILY WITH MEALS 180 tablet 0  . Multiple Vitamin (MULTIVITAMIN) tablet Take 1 tablet by mouth daily.    . valsartan (DIOVAN) 160 MG tablet Take 1 tablet (160 mg total) by mouth every morning. 90 tablet 3  . zolpidem (AMBIEN CR) 6.25 MG CR tablet TAKE 1 TO 2 TABLETS AT BEDTIME AS NEEDED 60 tablet 0  . vitamin C (ASCORBIC ACID) 500 MG tablet Take 500 mg by mouth daily. Reported on 11/08/2015    . Zinc 50 MG TABS Take 1 tablet by mouth daily. Reported on 11/08/2015     No current facility-administered medications for this visit.    Review of Systems:  GENERAL:  Feels good.  No fevers, sweats or weight loss. PERFORMANCE STATUS (ECOG):  0 HEENT:  No visual changes, runny nose, sore throat, mouth sores or tenderness. Lungs: No shortness of breath or cough.  No hemoptysis. Cardiac:  No chest pain, palpitations, orthopnea, or PND. GI:  No nausea, vomiting, diarrhea, constipation, melena or hematochezia. GU:  No urgency, frequency, dysuria, or hematuria. Musculoskeletal:  No back pain.  No joint pain.  No muscle tenderness. Extremities:  No pain or swelling. Skin:  No rashes or skin changes. Neuro:  No headache, numbness or weakness, balance or coordination issues. Endocrine:  Diabetes.  No thyroid issues, hot flashes or night sweats. Psych:  No mood changes, depression or anxiety. Pain:  No focal pain. Review of systems:  All other systems reviewed and found to be negative.  Physical Exam: Blood pressure 147/78, pulse 105, temperature 98.4 F (36.9 C), temperature source Tympanic, weight 209 lb 5.2 oz (94.95 kg). GENERAL:  Well developed, well nourished, sitting comfortably in the exam room in no acute  distress. MENTAL STATUS:  Alert and oriented to person, place and time. HEAD:  Short styled frosted hair.  Normocephalic, atraumatic, face symmetric, no Cushingoid features. EYES:  Blue eyes.  Pupils equal round and reactive to light and accomodation.  No conjunctivitis or scleral icterus. ENT:  Oropharynx clear without lesion.  Tongue normal. Mucous membranes moist.  RESPIRATORY:  Clear to auscultation without rales, wheezes or rhonchi. CARDIOVASCULAR:  Regular rate and rhythm without murmur, rub or gallop. BREAST:  Right breast without masses, skin changes or nipple discharge.  Left breast s/p wide excision with mild post operative and post radiation changes in the upper outer quadrant.  Area tender to touch.  No skin changes or nipple discharge.  ABDOMEN:  Soft, non-tender, with active bowel sounds, and no appreciable hepatosplenomegaly.  No masses. SKIN:  No rashes, ulcers or lesions. EXTREMITIES: No edema, no skin discoloration or tenderness.  No palpable cords. LYMPH NODES: No palpable cervical, supraclavicular, axillary or inguinal adenopathy  NEUROLOGICAL: Unremarkable. PSYCH:  Appropriate.  No visits with results within 3 Day(s) from this visit. Latest known visit with results is:  Office Visit on 10/01/2015  Component Date Value Ref Range Status  . Hgb A1c MFr Bld 10/01/2015 9.0* 4.6 - 6.5 % Final   Glycemic Control Guidelines for People with Diabetes:Non Diabetic:  <6%Goal of Therapy: <7%Additional Action Suggested:  >8%   . Sodium 10/01/2015 137  135 - 145 mEq/L Final  . Potassium 10/01/2015 3.9  3.5 - 5.1 mEq/L Final  . Chloride 10/01/2015 98  96 - 112 mEq/L Final  . CO2 10/01/2015 28  19 - 32 mEq/L Final  . Glucose, Bld 10/01/2015 205* 70 - 99 mg/dL Final  . BUN 10/01/2015 8  6 - 23 mg/dL Final  . Creatinine, Ser 10/01/2015 0.61  0.40 - 1.20 mg/dL Final  . Total Bilirubin 10/01/2015 0.5  0.2 - 1.2 mg/dL Final  . Alkaline Phosphatase 10/01/2015 87  39 - 117 U/L Final  .  AST 10/01/2015 109* 0 - 37 U/L Final  . ALT 10/01/2015 79* 0 - 35 U/L Final  . Total Protein 10/01/2015 7.6  6.0 - 8.3 g/dL Final  . Albumin 10/01/2015 4.3  3.5 - 5.2 g/dL Final  . Calcium 10/01/2015 9.5  8.4 - 10.5 mg/dL Final  . GFR 10/01/2015 106.50  >60.00 mL/min Final  . Cholesterol 10/01/2015 138  0 - 200 mg/dL Final   ATP III Classification       Desirable:  < 200 mg/dL               Borderline High:  200 - 239 mg/dL          High:  > = 240 mg/dL  . Triglycerides 10/01/2015 210.0* 0.0 - 149.0 mg/dL Final   Normal:  <150 mg/dLBorderline High:  150 - 199 mg/dL  . HDL 10/01/2015 37.70* >39.00 mg/dL Final  . VLDL 10/01/2015 42.0* 0.0 - 40.0 mg/dL Final  . Total CHOL/HDL Ratio 10/01/2015 4   Final                  Men          Women1/2 Average Risk     3.4          3.3Average Risk          5.0          4.42X Average Risk          9.6          7.13X Average Risk          15.0          11.0                      . NonHDL 10/01/2015 100.40   Final   NOTE:  Non-HDL goal should be 30 mg/dL higher than patient's LDL goal (i.e. LDL goal of < 70 mg/dL, would have non-HDL goal of < 100 mg/dL)  . Direct LDL 10/01/2015 83.0   Final   Optimal:  <100 mg/dLNear or Above Optimal:  100-129 mg/dLBorderline High:  130-159 mg/dLHigh:  160-189 mg/dLVery High:  >190 mg/dL    Assessment:  Connie Maldonado is a  60 y.o. female with stage IA left breast cancer s/p wide excision and sentinel lymph node exploration on 09/19/2015.  Pathology revealed a 4 mm  grade I invasive mammary carcinoma.  Margins were negative.  No sentinel lymph node was retrieved.  Tumor was ER > 90%, PR > 90%, and Her2/neu 1+.   Pathologic stage was T1aNxMx.  She completed Mammosite radiation on 10/26/2015.  CA27.29 was 20.2 on 11/08/2015.  Bone density study on 07/25/2010 was normal with a T-score of -0.38 in L1 - L4 and a T-score of -0.48 in the left proximal femur.  Bone density study on 10/17/2015 revealed osteopenia with a T-score of -1.8  in the left femoral neck.    Symptomatically, she is doing well.  She notes mild hot flashes.  Plan: 1.  Labs today:  CBC with diff, CMP, CA27.29, vitamin D 25 hydroxy. 2.  Discuss plan for 5 years of an aromatase inhibitor.  Side effects reviewed.  Information provided. 3.  Discuss osteopenia.  Discuss calcium 1200 mg and 800 IU a day.  Discuss staying active.  Discuss bisphosphonates (Fosamax, Actonel, Boniva) as well as Prolia every 6 months.  Side effects reviewed including osteonecrosis of the jaw.  Discuss dental follow-up every 6 months. 4.  Discuss vasomotor symptoms.  Discuss treatment with Effexor XR.  Patient considering.  Information provided. 5.  Rx:  Femara 2.5 mg a day po q day. 6.  RTC in 1 month for MD assessment and labs (LFTs).  Addendum:  LFTs were elevated (AST 100 and ALT 66).  Chart indicates history of elevated liver function tests for 5 years.  Etiology unclear.  Dr Tanja Port Aron's office contacted.  Message left.  She will require baseline liver imaging unless already performed.   Lequita Asal, MD  11/08/2015, 11:43 AM

## 2015-11-08 NOTE — Telephone Encounter (Signed)
Dr Christean Leaf from the cancer center called - She would like a return call in regards to why pt has elevated liver tests. She said she can wait until Monday  cb number is [phone] Thank you

## 2015-11-08 NOTE — Progress Notes (Signed)
Patient ambulates independently, patient denies pain or discomfort.  Vitals documented, medication record updated, information provided by patient.

## 2015-11-09 ENCOUNTER — Telehealth: Payer: Self-pay | Admitting: Hematology and Oncology

## 2015-11-09 LAB — CANCER ANTIGEN 27.29: CA 27.29: 20.2 U/mL (ref 0.0–38.6)

## 2015-11-09 LAB — VITAMIN D 25 HYDROXY (VIT D DEFICIENCY, FRACTURES): Vit D, 25-Hydroxy: 34.1 ng/mL (ref 30.0–100.0)

## 2015-11-09 NOTE — Telephone Encounter (Signed)
  Spoke with Talia Aron's office about patient's elevated liver function tests.   Per Epic, chronic problem. Dr. Deborra Medina out today. She will be back on Monday, 11/13/2015 to discuss.  Lequita Asal, MD

## 2015-11-10 ENCOUNTER — Encounter: Payer: Self-pay | Admitting: Hematology and Oncology

## 2015-11-12 ENCOUNTER — Telehealth: Payer: Self-pay | Admitting: Family Medicine

## 2015-11-12 ENCOUNTER — Ambulatory Visit (INDEPENDENT_AMBULATORY_CARE_PROVIDER_SITE_OTHER): Payer: BLUE CROSS/BLUE SHIELD | Admitting: Family Medicine

## 2015-11-12 ENCOUNTER — Encounter: Payer: Self-pay | Admitting: Internal Medicine

## 2015-11-12 ENCOUNTER — Encounter: Payer: Self-pay | Admitting: Family Medicine

## 2015-11-12 VITALS — BP 128/80 | HR 105 | Temp 97.5°F | Wt 210.8 lb

## 2015-11-12 DIAGNOSIS — J329 Chronic sinusitis, unspecified: Secondary | ICD-10-CM

## 2015-11-12 DIAGNOSIS — G4733 Obstructive sleep apnea (adult) (pediatric): Secondary | ICD-10-CM

## 2015-11-12 MED ORDER — PREDNISONE 20 MG PO TABS: ORAL_TABLET | ORAL | Status: DC

## 2015-11-12 MED ORDER — LEVOFLOXACIN 500 MG PO TABS: 500.0000 mg | ORAL_TABLET | Freq: Every day | ORAL | Status: DC

## 2015-11-12 NOTE — Telephone Encounter (Signed)
Hi Dr. Mike Gip, I was out of the office and just now received this message.  Ms. Luan just recently re established care with me after several years of seeing another provider. My assumption is her elevated LFTs are due to fatty liver but it is certainly time to re evaluate this as it has not been done so in years. Thank you so much for taking such good care of our patients and let me know what I can do to help.  Tanja Port

## 2015-11-12 NOTE — Telephone Encounter (Signed)
Pt gave me copy of her sleep study and asks that I refer to [facility] for CPAP management.  Referral placed and copy of report in my box.

## 2015-11-12 NOTE — Patient Instructions (Signed)
Good to see you. Please take your medications as directed. Keep me updated.

## 2015-11-12 NOTE — Progress Notes (Signed)
SUBJECTIVE:  Connie Maldonado is a 61 y.o. female who complains of coryza, sneezing, productive cough and bilateral sinus pain for 8 days. She denies a history of anorexia, chest pain and dizziness and denies a history of asthma. Patient denies smoke cigarettes.   H/o recurrent sinus infections requiring steroids and strong antibiotics.   Last infection was in 05/2015  Was given levquain and prednisone by ENT, Dr. Richardson Landry.  Current Outpatient Prescriptions on File Prior to Visit  Medication Sig Dispense Refill  . ALPRAZolam (XANAX) 0.25 MG tablet TAKE ONE TABLET BY MOUTH 3 TIMES DAILY AS NEEDED (Patient taking differently: as needed. TAKE ONE TABLET BY MOUTH 3 TIMES DAILY AS NEEDED) 90 tablet 0  . amLODipine (NORVASC) 10 MG tablet Take 1 tablet (10 mg total) by mouth every morning. 90 tablet 3  . atorvastatin (LIPITOR) 40 MG tablet Take 40 mg by mouth every morning.    . Calcium Carb-Cholecalciferol (CALCIUM 600 + D PO) Take 1 tablet by mouth daily.    . CHANTIX 1 MG tablet TAKE ONE TABLET TWICE DAILY 56 tablet 3  . esomeprazole (NEXIUM) 40 MG capsule Take 1 capsule (40 mg total) by mouth daily. 30 capsule 3  . fluconazole (DIFLUCAN) 150 MG tablet Take 1 tablet (150 mg total) by mouth every 3 (three) days. 2 tablet 0  . HYDROcodone-acetaminophen (VICODIN) 5-500 MG per tablet Take 1 tablet by mouth every 6 (six) hours as needed. Reported on 09/26/2015    . letrozole (FEMARA) 2.5 MG tablet Take 1 tablet (2.5 mg total) by mouth daily. 30 tablet 0  . metaxalone (SKELAXIN) 800 MG tablet daily as needed. Take one by mouth at bedtime    . metFORMIN (GLUCOPHAGE) 500 MG tablet TAKE ONE TABLET TWICE DAILY WITH MEALS 180 tablet 0  . Multiple Vitamin (MULTIVITAMIN) tablet Take 1 tablet by mouth daily.    . valsartan (DIOVAN) 160 MG tablet Take 1 tablet (160 mg total) by mouth every morning. 90 tablet 3  . vitamin C (ASCORBIC ACID) 500 MG tablet Take 500 mg by mouth daily. Reported on 11/08/2015    . Zinc 50 MG  TABS Take 1 tablet by mouth daily. Reported on 11/08/2015    . zolpidem (AMBIEN CR) 6.25 MG CR tablet TAKE 1 TO 2 TABLETS AT BEDTIME AS NEEDED 60 tablet 0   No current facility-administered medications on file prior to visit.    No Known Allergies  Past Medical History  Diagnosis Date  . GERD (gastroesophageal reflux disease)   . Hyperlipidemia   . Hypertension   . Anxiety   . Diabetes mellitus without complication ([place])   . Sleep apnea     + SLEEP STUDY 2 YEARS AGO BUT PT STATES NO ONE EVER HELPED HER GET A CPAP MACHINE   . Lumbar pain   . Anemia   . Cancer ([place])   . Breast cancer of upper-outer quadrant of left female breast ([place]) 09/19/2015    4 mm, T1a, N0l ER positive, PR positive, HER-2/neu not amplified. Wide excision, MammoSite.    Past Surgical History  Procedure Laterality Date  . Tubal ligation    . [place]  [place]    6 mm hyperplastic polyp, internal hemorrhoids  . Esophagogastroduodenoscopy  06[place]/2008 ([place]    reflux esophagitis, gastritis  . Dilation and curettage of uterus      X 2  . Tonsillectomy      AGE 47  . Breast lumpectomy with sentinel lymph node biopsy Left 09/19/2015  Procedure: BREAST LUMPECTOMY WITH SENTINEL LYMPH NODE BX;  Surgeon: Robert Bellow, MD;  Location: [facility] ORS;  Service: General;  Laterality: Left;  . Axillary lymph node biopsy Left 09/19/2015    Procedure: AXILLARY LYMPH NODE BIOPSY;  Surgeon: Robert Bellow, MD;  Location: [facility] ORS;  Service: General;  Laterality: Left;    Family History  Problem Relation Age of Onset  . Heart disease Mother   . Hypertension Mother   . Cancer Father     stomach  . Stomach cancer Father 98  . Colon cancer Father     Social History   Social History  . Marital Status: Single    Spouse Name: N/A  . Number of Children: N/A  . Years of Education: N/A   Occupational History  . Not on file.   Social History Main Topics  . Smoking status: Former Smoker    Types: Cigarettes   . Smokeless tobacco: Never Used     Comment: PT SMOKES 1 CIG MAYBE EVERY WEEK WITH THE HELP OF CHANTIX BUT PRIOR TO THAT PT WAS SMOKING 1PPD  . Alcohol Use: 1.0 oz/week    2 Standard drinks or equivalent per week     Comment: OCC  . Drug Use: No  . Sexual Activity: No   Other Topics Concern  . Not on file   Social History Narrative   The PMH, PSH, Social History, Family History, Medications, and allergies have been reviewed in [facility], and have been updated if relevant.  OBJECTIVE: BP 128/80 mmHg  Pulse 105  Temp(Src) 97.5 F (36.4 C) (Oral)  Wt 210 lb 12.8 oz (95.618 kg)  SpO2 96%  She appears well, vital signs are as noted. Ears normal.  Throat and pharynx normal.  Neck supple. No adenopathy in the neck. Nose is congested. Sinuses tender throughout. The chest is clear, without wheezes or rales.  ASSESSMENT:  bronchitis  PLAN: Given duration and progression of symptoms, will treat for bacterial sinusitis with levaquin and prednsione.  Symptomatic therapy suggested: push fluids, rest and return office visit prn if symptoms persist or worsen.  Call or return to clinic prn if these symptoms worsen or fail to improve as anticipated.

## 2015-11-20 ENCOUNTER — Other Ambulatory Visit: Payer: Self-pay | Admitting: Hematology and Oncology

## 2015-11-20 ENCOUNTER — Encounter: Payer: Self-pay | Admitting: Internal Medicine

## 2015-11-20 ENCOUNTER — Ambulatory Visit (INDEPENDENT_AMBULATORY_CARE_PROVIDER_SITE_OTHER): Payer: BLUE CROSS/BLUE SHIELD | Admitting: Internal Medicine

## 2015-11-20 VITALS — BP 134/72 | HR 107 | Ht 67.0 in | Wt 207.0 lb

## 2015-11-20 DIAGNOSIS — C50412 Malignant neoplasm of upper-outer quadrant of left female breast: Secondary | ICD-10-CM

## 2015-11-20 DIAGNOSIS — R7989 Other specified abnormal findings of blood chemistry: Secondary | ICD-10-CM

## 2015-11-20 DIAGNOSIS — G4733 Obstructive sleep apnea (adult) (pediatric): Secondary | ICD-10-CM

## 2015-11-20 DIAGNOSIS — R945 Abnormal results of liver function studies: Secondary | ICD-10-CM

## 2015-11-20 NOTE — Addendum Note (Signed)
Addended by: Maryanna Shape A on: 11/20/2015 02:04 PM   Modules accepted: Orders

## 2015-11-20 NOTE — Patient Instructions (Addendum)
WILL SEND FOR CPAP TITRATION STUDY.    Sleep Apnea Sleep apnea is disorder that affects a person's sleep. A person with sleep apnea has abnormal pauses in their breathing when they sleep. It is hard for them to get a good sleep. This makes a person tired during the day. It also can lead to other physical problems. There are three types of sleep apnea. One type is when breathing stops for a short time because your airway is blocked (obstructive sleep apnea). Another type is when the brain sometimes fails to give the normal signal to breathe to the muscles that control your breathing (central sleep apnea). The third type is a combination of the other two types. HOME CARE   Take all medicine as told by your doctor.  Avoid alcohol, calming medicines (sedatives), and depressant drugs.  Try to lose weight if you are overweight. Talk to your doctor about a healthy weight goal.  Your doctor may have you use a device that helps to open your airway. It can help you get the air that you need. It is called a positive airway pressure (PAP) device.   MAKE SURE YOU:   Understand these instructions.  Will watch your condition.  Will get help right away if you are not doing well or get worse.  It may take approximately 1 month for you to get used to wearing her CPAP every night.

## 2015-11-20 NOTE — Addendum Note (Signed)
Addended by: Maryanna Shape A on: 11/20/2015 03:22 PM   Modules accepted: Orders

## 2015-11-20 NOTE — Progress Notes (Signed)
[facility] Consultation      Assessment and Plan:  Obstructive sleep apnea. -Will send for CPAP titration study. Explained that her insurance may require to start from the beginning again.    Obesity. -Weight loss may be beneficial.   Essential hypertension.  -Treatment of Sleep apnea may help with blood pressure control.   Diabetes Mellitus.  -OSA can make blood glucose control worse, therefore it is important to treat OSA.    Date: 11/20/2015  MRN# [id number] Connie Maldonado 04/07/1956  Referring Physician: Dr. Tanja Port.   Connie Maldonado is a 60 y.o. old female seen in consultation for chief complaint of:    Chief Complaint  Patient presents with  . sleep consult    pt ref by dr. Deborra Medina. pt c/o loud snoring & daytime sleepiness. EPWORTH:4    HPI:   The patient has a history of stage IA left breast cancer s/p wide excision and sentinel lymph node exploration on 09/19/2015. Status post MammoSite and radiation. She also has a history of GERD, hypertension, anxiety, diabetes mellitus, obstructive sleep apnea, though was never started on CPAP because of personal reasons. She takes xanax less than once per month.  She has trouble staying asleep, and the [place] helps. If she does not take it, she will wake up to urinate between 2 and 5 and then can no go back to sleep.  She is currently taking a course of prednisone for sinusitis.   She now takes [place] CR once at night. She takes skelaxin for back pain 2 or 3 times per week. She takes vicodin less than once per day.   Patient typically goes to bed around 11 PM, she wakes up about 2-3 times per night. She typically gets out of bed at 7 AM.  Review of the  tracings from 12/28/2011: Sleep latency 66 minutes, sleep efficiency 91%. Apnea-hypopnea index was 13 consistent with mild obstructive sleep apnea.   PMHX:   Past Medical History  Diagnosis Date  . GERD (gastroesophageal reflux disease)   . Hyperlipidemia     . Hypertension   . Anxiety   . Diabetes mellitus without complication ([place])   . Sleep apnea     + SLEEP STUDY 2 YEARS AGO BUT PT STATES NO ONE EVER HELPED HER GET A CPAP MACHINE   . Lumbar pain   . Anemia   . Cancer ([place])   . Breast cancer of upper-outer quadrant of left female breast ([place]) 09/19/2015    4 mm, T1a, N0l ER positive, PR positive, HER-2/neu not amplified. Wide excision, MammoSite.   Surgical Hx:  Past Surgical History  Procedure Laterality Date  . Tubal ligation    . [place]  [place]    6 mm hyperplastic polyp, internal hemorrhoids  . Esophagogast[place]odenoscopy  [place] ([place]    reflux esophagitis, gastritis  . Dilation and curettage of uterus      X 2  . Tonsillectomy      AGE 21  . Breast lumpectomy with sentinel lymph node biopsy Left 09/19/2015    Procedure: BREAST LUMPECTOMY WITH SENTINEL LYMPH NODE BX;  Surgeon: Robert Bellow, MD;  Location: [facility] ORS;  Service: General;  Laterality: Left;  . Axillary lymph node biopsy Left 09/19/2015    Procedure: AXILLARY LYMPH NODE BIOPSY;  Surgeon: Robert Bellow, MD;  Location: [facility] ORS;  Service: General;  Laterality: Left;   Family Hx:  Family History  Problem Relation Age of Onset  . Heart disease Mother   .  Hypertension Mother   . Cancer Father     stomach  . Stomach cancer Father 6  . Colon cancer Father    Social Hx:   Social History  Substance Use Topics  . Smoking status: Former Smoker    Types: Cigarettes  . Smokeless tobacco: Never Used     Comment: PT SMOKES 1 CIG MAYBE EVERY WEEK WITH THE HELP OF CHANTIX BUT PRIOR TO THAT PT WAS SMOKING 1PPD  . Alcohol Use: 1.0 oz/week    2 Standard drinks or equivalent per week     Comment: OCC   Medication:   Current Outpatient Rx  Name  Route  Sig  Dispense  Refill  . ALPRAZolam (XANAX) 0.25 MG tablet      TAKE ONE TABLET BY MOUTH 3 TIMES DAILY AS NEEDED Patient taking differently: as needed. TAKE ONE TABLET BY MOUTH 3 TIMES DAILY AS  NEEDED   90 tablet   0   . amLODipine (NORVASC) 10 MG tablet   Oral   Take 1 tablet (10 mg total) by mouth every morning.   90 tablet   3   . atorvastatin (LIPITOR) 40 MG tablet   Oral   Take 40 mg by mouth every morning.         . Calcium Carb-Cholecalciferol (CALCIUM 600 + D PO)   Oral   Take 1 tablet by mouth daily.         . CHANTIX 1 MG tablet      TAKE ONE TABLET TWICE DAILY   56 tablet   3     PROFILE   . esomeprazole (NEXIUM) 40 MG capsule   Oral   Take 1 capsule (40 mg total) by mouth daily.   30 capsule   3   . fluconazole (DIFLUCAN) 150 MG tablet   Oral   Take 1 tablet (150 mg total) by mouth every 3 (three) days.   2 tablet   0   . HYDROcodone-acetaminophen (VICODIN) 5-500 MG per tablet   Oral   Take 1 tablet by mouth every 6 (six) hours as needed. Reported on 09/26/2015         . letrozole (FEMARA) 2.5 MG tablet   Oral   Take 1 tablet (2.5 mg total) by mouth daily.   30 tablet   0   . levofloxacin (LEVAQUIN) 500 MG tablet   Oral   Take 1 tablet (500 mg total) by mouth daily.   10 tablet   0   . metaxalone (SKELAXIN) 800 MG tablet      daily as needed. Take one by mouth at bedtime         . metFORMIN (GLUCOPHAGE) 500 MG tablet      TAKE ONE TABLET TWICE DAILY WITH MEALS   180 tablet   0   . Multiple Vitamin (MULTIVITAMIN) tablet   Oral   Take 1 tablet by mouth daily.         . predniSONE (DELTASONE) 20 MG tablet      3 tabs by mouth x 3 days, 2 tabs by mouth x 2 days, 1 tab by mouth x 1 day, 1/2 tab by mouth x 1 day   16 tablet   0   . valsartan (DIOVAN) 160 MG tablet   Oral   Take 1 tablet (160 mg total) by mouth every morning.   90 tablet   3   . vitamin C (ASCORBIC ACID) 500 MG tablet   Oral  Take 500 mg by mouth daily. Reported on 11/08/2015         . Zinc 50 MG TABS   Oral   Take 1 tablet by mouth daily. Reported on 11/08/2015         . zolpidem (AMBIEN CR) 6.25 MG CR tablet      TAKE 1 TO 2 TABLETS  AT BEDTIME AS NEEDED   60 tablet   0       Allergies:  Review of patient's allergies indicates no known allergies.  Review of Systems: Gen:  Denies  fever, sweats, chills HEENT: Denies blurred vision, double vision.  Cvc:  No dizziness, chest pain. Resp:   Denies cough or sputum production, shortness of breath Gi: Denies swallowing difficulty, stomach pain. Gu:  Denies bladder incontinence, burning urine Ext:   No Joint pain, stiffness. Skin: No skin rash,  hives  Endoc:  No polyuria, polydipsia. Psych: No depression, insomnia. Other:  All other systems were reviewed with the patient and were negative other that what is mentioned in the HPI.   Physical Examination:   VS: BP 134/72 mmHg  Pulse 107  Ht '5\' 7"'$  (1.702 m)  Wt 207 lb (93.895 kg)  BMI 32.41 kg/m2  SpO2 96%  General Appearance: No distress  Neuro:without focal findings,  speech normal,  HEENT: PERRLA, EOM intact.   Pulmonary: normal breath sounds, No wheezing.  CardiovascularNormal S1,S2.  No m/r/g.   Abdomen: Benign, Soft, non-tender. Renal:  No costovertebral tenderness  GU:  No performed at this time. Endoc: No evident thyromegaly, no signs of acromegaly. Skin:   warm, no rashes, no ecchymosis  Extremities: normal, no cyanosis, clubbing.  Other findings:    LABORATORY PANEL:   CBC No results for input(s): WBC, HGB, HCT, PLT in the last 168 hours. ------------------------------------------------------------------------------------------------------------------  Chemistries  No results for input(s): NA, K, CL, CO2, GLUCOSE, BUN, CREATININE, CALCIUM, MG, AST, ALT, ALKPHOS, BILITOT in the last 168 hours.  Invalid input(s): GFRCGP ------------------------------------------------------------------------------------------------------------------  Cardiac Enzymes No results for input(s): TROPONINI in the last 168 hours. ------------------------------------------------------------  RADIOLOGY:  No  results found.     Thank  you for the consultation and for allowing [facility] to assist in the care of your patient. Our recommendations are noted above.  Please contact us if we can be of further service.   Marda Stalker, MD.  Board Certified in Internal Medicine, Pulmonary Medicine, [place], and Sleep Medicine.  [facility] Office Number: [phone]  Patricia Pesa, M.D.  Vilinda Boehringer, M.D.  Merton Border, M.D  11/20/2015

## 2015-11-26 ENCOUNTER — Telehealth: Payer: Self-pay | Admitting: Family Medicine

## 2015-11-26 ENCOUNTER — Telehealth: Payer: Self-pay

## 2015-11-26 MED ORDER — FLUCONAZOLE 150 MG PO TABS: 150.0000 mg | ORAL_TABLET | Freq: Once | ORAL | Status: AC

## 2015-11-26 NOTE — Telephone Encounter (Signed)
Returned pt's phone call to answer questions about CT scan with contrast.  Directed pt to pick up CT prep kit in medical mall.  Per pt she will be a grump if she cannot eat.  Explained to pt per CT instructions she can have clear liquids up until time of procedure.  Explained lab would be done here and CT elsewhere.  No other concerns noted pt reports she is not in great mood.  No other concerns noted.

## 2015-11-26 NOTE — Telephone Encounter (Signed)
eRx sent for diflucan.  Has she tried an antihistamine like claritin or zyrtec for her post nasal drip?

## 2015-11-26 NOTE — Telephone Encounter (Addendum)
Patient has an yeast infection because of the antibiotic she has been taking and needs something called in for it.  Also the patient is still experiencing post nasal drip.  So she is taking Mucinex once a day.  If you need to call her, please call [phone].

## 2015-11-26 NOTE — Telephone Encounter (Signed)
Lm on pts vm and advised Rx sent. Pt advised to try antihistamine as directed and contact office back if no improvement, or if she has already tried

## 2015-12-03 ENCOUNTER — Ambulatory Visit: Payer: BLUE CROSS/BLUE SHIELD | Admitting: Radiation Oncology

## 2015-12-03 ENCOUNTER — Inpatient Hospital Stay
Admission: RE | Admit: 2015-12-03 | Payer: BLUE CROSS/BLUE SHIELD | Source: Ambulatory Visit | Admitting: Radiation Oncology

## 2015-12-04 ENCOUNTER — Ambulatory Visit
Admission: RE | Admit: 2015-12-04 | Discharge: 2015-12-04 | Disposition: A | Discharge status: Home / Self Care | Payer: BLUE CROSS/BLUE SHIELD | Source: Ambulatory Visit | Attending: Hematology and Oncology | Admitting: Hematology and Oncology

## 2015-12-04 ENCOUNTER — Inpatient Hospital Stay: Payer: BLUE CROSS/BLUE SHIELD | Attending: Hematology and Oncology

## 2015-12-04 DIAGNOSIS — Z87891 Personal history of nicotine dependence: Secondary | ICD-10-CM | POA: Insufficient documentation

## 2015-12-04 DIAGNOSIS — Z79899 Other long term (current) drug therapy: Secondary | ICD-10-CM | POA: Insufficient documentation

## 2015-12-04 DIAGNOSIS — G4730 Sleep apnea, unspecified: Secondary | ICD-10-CM | POA: Insufficient documentation

## 2015-12-04 DIAGNOSIS — N200 Calculus of kidney: Secondary | ICD-10-CM | POA: Insufficient documentation

## 2015-12-04 DIAGNOSIS — Z170 Estrogen receptor positive status [ER+]: Secondary | ICD-10-CM | POA: Insufficient documentation

## 2015-12-04 DIAGNOSIS — M8580 Other specified disorders of bone density and structure, unspecified site: Secondary | ICD-10-CM | POA: Insufficient documentation

## 2015-12-04 DIAGNOSIS — R945 Abnormal results of liver function studies: Secondary | ICD-10-CM

## 2015-12-04 DIAGNOSIS — R7989 Other specified abnormal findings of blood chemistry: Secondary | ICD-10-CM | POA: Insufficient documentation

## 2015-12-04 DIAGNOSIS — K760 Fatty (change of) liver, not elsewhere classified: Secondary | ICD-10-CM | POA: Insufficient documentation

## 2015-12-04 DIAGNOSIS — I10 Essential (primary) hypertension: Secondary | ICD-10-CM | POA: Insufficient documentation

## 2015-12-04 DIAGNOSIS — Z7952 Long term (current) use of systemic steroids: Secondary | ICD-10-CM | POA: Diagnosis not present

## 2015-12-04 DIAGNOSIS — E785 Hyperlipidemia, unspecified: Secondary | ICD-10-CM | POA: Diagnosis not present

## 2015-12-04 DIAGNOSIS — C50412 Malignant neoplasm of upper-outer quadrant of left female breast: Secondary | ICD-10-CM | POA: Diagnosis present

## 2015-12-04 DIAGNOSIS — R232 Flushing: Secondary | ICD-10-CM | POA: Insufficient documentation

## 2015-12-04 DIAGNOSIS — E119 Type 2 diabetes mellitus without complications: Secondary | ICD-10-CM | POA: Insufficient documentation

## 2015-12-04 DIAGNOSIS — R160 Hepatomegaly, not elsewhere classified: Secondary | ICD-10-CM | POA: Insufficient documentation

## 2015-12-04 DIAGNOSIS — Z800 Family history of malignant neoplasm of digestive organs: Secondary | ICD-10-CM | POA: Insufficient documentation

## 2015-12-04 DIAGNOSIS — Z7984 Long term (current) use of oral hypoglycemic drugs: Secondary | ICD-10-CM | POA: Insufficient documentation

## 2015-12-04 DIAGNOSIS — I2510 Atherosclerotic heart disease of native coronary artery without angina pectoris: Secondary | ICD-10-CM | POA: Insufficient documentation

## 2015-12-04 DIAGNOSIS — Z79811 Long term (current) use of aromatase inhibitors: Secondary | ICD-10-CM | POA: Insufficient documentation

## 2015-12-04 LAB — HEPATIC FUNCTION PANEL
ALT: 51 U/L (ref 14–54)
AST: 71 U/L — ABNORMAL HIGH (ref 15–41)
Albumin: 3.7 g/dL (ref 3.5–5.0)
Alkaline Phosphatase: 77 U/L (ref 38–126)
Bilirubin, Direct: 0.1 mg/dL (ref 0.1–0.5)
Indirect Bilirubin: 0.5 mg/dL (ref 0.3–0.9)
Total Bilirubin: 0.6 mg/dL (ref 0.3–1.2)
Total Protein: 7.4 g/dL (ref 6.5–8.1)

## 2015-12-04 MED ORDER — IOPAMIDOL (ISOVUE-300) INJECTION 61%
100.0000 mL | Freq: Once | INTRAVENOUS | Status: AC | PRN
  Administered 2015-12-04: 100 mL via INTRAVENOUS

## 2015-12-05 LAB — HEPATITIS B CORE ANTIBODY, TOTAL: Hep B Core Total Ab: NEGATIVE

## 2015-12-05 LAB — HEPATITIS C ANTIBODY: HCV Ab: <0.1 s/co ratio (ref 0.0–0.9)

## 2015-12-05 LAB — HEPATITIS B SURFACE ANTIGEN: Hepatitis B Surface Ag: NEGATIVE

## 2015-12-06 ENCOUNTER — Telehealth: Payer: Self-pay | Admitting: *Deleted

## 2015-12-06 ENCOUNTER — Telehealth: Payer: Self-pay | Admitting: Hematology and Oncology

## 2015-12-06 ENCOUNTER — Telehealth: Payer: Self-pay | Admitting: Internal Medicine

## 2015-12-06 ENCOUNTER — Other Ambulatory Visit: Payer: Self-pay | Admitting: *Deleted

## 2015-12-06 DIAGNOSIS — G4733 Obstructive sleep apnea (adult) (pediatric): Secondary | ICD-10-CM

## 2015-12-06 MED ORDER — LETROZOLE 2.5 MG PO TABS: 2.5000 mg | ORAL_TABLET | Freq: Every day | ORAL | Status: DC

## 2015-12-06 NOTE — Telephone Encounter (Signed)
Has she tried a nasal spray like flonase or nasocort?

## 2015-12-06 NOTE — Telephone Encounter (Signed)
She had a truly horrible experience trying to get blood drawn last Tuesday. She said the people in the lab were fine but that she was not. She said she had to fast for a scan and so she was dehydrated and they couldn't get a vein. She said they stuck her so many times that by the time she left she was crying and told them she'd never do it again. She told them when they finally got blood to "take extra" because she was not going to do the labs on the 25th. She called today to have me cancel the lab portion of her visit on 12/13/15. I just wanted to make you aware that she is not willing to do lab draw that day and she said she hopes you can get what you need from the labs on 12/04/15. Thanks.

## 2015-12-06 NOTE — Telephone Encounter (Signed)
Pt states she is still waiting to hear back regarding her sleep test. Please call.

## 2015-12-06 NOTE — Telephone Encounter (Signed)
Pt left voicemail at Triage. Pt said since seeing Dr. Deborra Medina last month for a sinus inf. she is still dealing with sinus issues. Pt said she still has post nasal drip, coughing, and a sinus HA, she is taken mucinex once a day, and tylenol sinus, and robitussin as needed. Pt doesn't know what else to do to get her sxs resolved

## 2015-12-06 NOTE — Telephone Encounter (Signed)
Called pt and she states that she is under a lot of stress and that she had to be NPO for the test and so when she came to cancer center she got stuck several times for rthe lab and then she did not know that she had to have IV in CT. She thought the stuff she drank was all and she hates needles and IV's and it was a bad day at both sides from IV stick and needles for blood.  She also said that after she agreed to the ct scan that she never got a call she just got the papers in the mail and she would have appreciated a call and going over instructions with her.  She ended up calling one day anyway to clarify the instructions but it was just a bad day.  i did explain the importance of why it was done and she was glad that is was checked but it was not a day she would like to repeat. She will be in next week to see md.

## 2015-12-06 NOTE — Telephone Encounter (Signed)
Spoke to pt who states she has not, but will try as advised

## 2015-12-06 NOTE — Telephone Encounter (Signed)
Informed pt that DR needs to sign paperwork for the insurance company. Pt ask if you will keep her informed while the process is happening.

## 2015-12-07 NOTE — Telephone Encounter (Signed)
[employer] form completed this morning by DR. Form faxed to [facility] requesting HST. Waiting on response from [place]. [name]

## 2015-12-10 NOTE — Telephone Encounter (Signed)
Called and spoke with patient and advised that we have completed paperwork and this has been faxed to [facility]. Advised patient that I was hoping that we would hear something from [facility] today, but as of 4:56 pm we have not heard anything.  Should get decision within the week and that I will contact her as soon as I hear from [facility]. Pt voiced understanding and thanked me for my time. [name]

## 2015-12-11 NOTE — Telephone Encounter (Signed)
[employer] [place] approved HST. Authorization # [id number] Valid from 12/07/15 to 01/06/16.  Called to inform patient, no answer. LMOAM for pt to return my call. [name]

## 2015-12-12 NOTE — Telephone Encounter (Signed)
Pt question was not the status of her sleep test, but would the sleep study she had on 01/07/2012 would still be good to arrange CPAP therapy.  Pt never went on CPAP therapy or oral appliance.  I advised patient that I believe that her study back in 2013 should still be good and would not require her to have another sleep study.  I advised patient that I would contact DME to provide some assistance on this issue and that I would contact her back on Wed to advise. [name]

## 2015-12-13 ENCOUNTER — Ambulatory Visit (INDEPENDENT_AMBULATORY_CARE_PROVIDER_SITE_OTHER): Payer: BLUE CROSS/BLUE SHIELD | Admitting: Internal Medicine

## 2015-12-13 ENCOUNTER — Other Ambulatory Visit: Payer: BLUE CROSS/BLUE SHIELD

## 2015-12-13 ENCOUNTER — Encounter: Payer: Self-pay | Admitting: Internal Medicine

## 2015-12-13 ENCOUNTER — Encounter: Payer: Self-pay | Admitting: Radiation Oncology

## 2015-12-13 ENCOUNTER — Ambulatory Visit
Admission: RE | Admit: 2015-12-13 | Discharge: 2015-12-13 | Disposition: A | Discharge status: Home / Self Care | Payer: BLUE CROSS/BLUE SHIELD | Source: Ambulatory Visit | Attending: Radiation Oncology | Admitting: Radiation Oncology

## 2015-12-13 ENCOUNTER — Inpatient Hospital Stay (HOSPITAL_BASED_OUTPATIENT_CLINIC_OR_DEPARTMENT_OTHER): Payer: BLUE CROSS/BLUE SHIELD | Admitting: Hematology and Oncology

## 2015-12-13 VITALS — Ht 67.0 in

## 2015-12-13 VITALS — BP 154/72 | HR 92 | Temp 98.2°F | Resp 20 | Ht 67.0 in | Wt 208.7 lb

## 2015-12-13 VITALS — BP 122/70 | HR 100 | Temp 98.2°F | Wt 209.0 lb

## 2015-12-13 DIAGNOSIS — J0101 Acute recurrent maxillary sinusitis: Secondary | ICD-10-CM | POA: Diagnosis not present

## 2015-12-13 DIAGNOSIS — Z170 Estrogen receptor positive status [ER+]: Secondary | ICD-10-CM | POA: Insufficient documentation

## 2015-12-13 DIAGNOSIS — E119 Type 2 diabetes mellitus without complications: Secondary | ICD-10-CM

## 2015-12-13 DIAGNOSIS — R7989 Other specified abnormal findings of blood chemistry: Secondary | ICD-10-CM

## 2015-12-13 DIAGNOSIS — I10 Essential (primary) hypertension: Secondary | ICD-10-CM

## 2015-12-13 DIAGNOSIS — Z79899 Other long term (current) drug therapy: Secondary | ICD-10-CM

## 2015-12-13 DIAGNOSIS — Z87891 Personal history of nicotine dependence: Secondary | ICD-10-CM

## 2015-12-13 DIAGNOSIS — Z923 Personal history of irradiation: Secondary | ICD-10-CM | POA: Insufficient documentation

## 2015-12-13 DIAGNOSIS — K760 Fatty (change of) liver, not elsewhere classified: Secondary | ICD-10-CM

## 2015-12-13 DIAGNOSIS — Z79811 Long term (current) use of aromatase inhibitors: Secondary | ICD-10-CM | POA: Insufficient documentation

## 2015-12-13 DIAGNOSIS — C50912 Malignant neoplasm of unspecified site of left female breast: Secondary | ICD-10-CM | POA: Insufficient documentation

## 2015-12-13 DIAGNOSIS — Z7984 Long term (current) use of oral hypoglycemic drugs: Secondary | ICD-10-CM

## 2015-12-13 DIAGNOSIS — E785 Hyperlipidemia, unspecified: Secondary | ICD-10-CM

## 2015-12-13 DIAGNOSIS — C50412 Malignant neoplasm of upper-outer quadrant of left female breast: Secondary | ICD-10-CM

## 2015-12-13 DIAGNOSIS — R160 Hepatomegaly, not elsewhere classified: Secondary | ICD-10-CM

## 2015-12-13 DIAGNOSIS — R232 Flushing: Secondary | ICD-10-CM

## 2015-12-13 DIAGNOSIS — R945 Abnormal results of liver function studies: Secondary | ICD-10-CM

## 2015-12-13 DIAGNOSIS — Z7952 Long term (current) use of systemic steroids: Secondary | ICD-10-CM

## 2015-12-13 MED ORDER — PREDNISONE 20 MG PO TABS: 40.0000 mg | ORAL_TABLET | Freq: Every day | ORAL | Status: DC

## 2015-12-13 MED ORDER — AMOXICILLIN-POT CLAVULANATE 875-125 MG PO TABS: 1.0000 | ORAL_TABLET | Freq: Two times a day (BID) | ORAL | Status: DC

## 2015-12-13 MED ORDER — FLUCONAZOLE 150 MG PO TABS: 150.0000 mg | ORAL_TABLET | Freq: Once | ORAL | Status: DC

## 2015-12-13 NOTE — Progress Notes (Signed)
No changes since last visit.  Here for CT Results

## 2015-12-13 NOTE — Progress Notes (Signed)
Pre visit review using our clinic review tool, if applicable. No additional management support is needed unless otherwise documented below in the visit note. 

## 2015-12-13 NOTE — Progress Notes (Signed)
Subjective:    Patient ID: Connie Maldonado, female    DOB: 11-17-1955, 60 y.o.   MRN: [id number]  HPI Here due to respiratory illness  She feels she has a sinus infection Has these yearly--usually needs levaquin and prednisone Was seen a month ago--treated but never completely resolved Drainage and hacky cough Never fully recovered and now worse Wheeze at times No SOB No fever No chills or sweats  Using mucinex and other OTC--- not clearly helping  Current Outpatient Prescriptions on File Prior to Visit  Medication Sig Dispense Refill  . ALPRAZolam (XANAX) 0.25 MG tablet TAKE ONE TABLET BY MOUTH 3 TIMES DAILY AS NEEDED (Patient taking differently: as needed. TAKE ONE TABLET BY MOUTH 3 TIMES DAILY AS NEEDED) 90 tablet 0  . amLODipine (NORVASC) 10 MG tablet Take 1 tablet (10 mg total) by mouth every morning. 90 tablet 3  . atorvastatin (LIPITOR) 40 MG tablet Take 40 mg by mouth every morning.    . Calcium Carb-Cholecalciferol (CALCIUM 600 + D PO) Take 1 tablet by mouth daily.    . CHANTIX 1 MG tablet TAKE ONE TABLET TWICE DAILY 56 tablet 3  . esomeprazole (NEXIUM) 40 MG capsule Take 1 capsule (40 mg total) by mouth daily. 30 capsule 3  . HYDROcodone-acetaminophen (VICODIN) 5-500 MG per tablet Take 1 tablet by mouth every 6 (six) hours as needed. Reported on 09/26/2015    . letrozole (FEMARA) 2.5 MG tablet Take 1 tablet (2.5 mg total) by mouth daily. 90 tablet 0  . metaxalone (SKELAXIN) 800 MG tablet daily as needed. Take one by mouth at bedtime    . metFORMIN (GLUCOPHAGE) 500 MG tablet TAKE ONE TABLET TWICE DAILY WITH MEALS 180 tablet 0  . Multiple Vitamin (MULTIVITAMIN) tablet Take 1 tablet by mouth daily.    . valsartan (DIOVAN) 160 MG tablet Take 1 tablet (160 mg total) by mouth every morning. 90 tablet 3  . vitamin C (ASCORBIC ACID) 500 MG tablet Take 500 mg by mouth daily. Reported on 11/08/2015    . Zinc 50 MG TABS Take 1 tablet by mouth daily. Reported on 11/08/2015    .  zolpidem (AMBIEN CR) 6.25 MG CR tablet TAKE 1 TO 2 TABLETS AT BEDTIME AS NEEDED 60 tablet 0   No current facility-administered medications on file prior to visit.    No Known Allergies  Past Medical History  Diagnosis Date  . GERD (gastroesophageal reflux disease)   . Hyperlipidemia   . Hypertension   . Anxiety   . Diabetes mellitus without complication ([place])   . Sleep apnea     + SLEEP STUDY 2 YEARS AGO BUT PT STATES NO ONE EVER HELPED HER GET A CPAP MACHINE   . Lumbar pain   . Anemia   . Cancer ([place])   . Breast cancer of upper-outer quadrant of left female breast ([place]) 09/19/2015    4 mm, T1a, N0l ER positive, PR positive, HER-2/neu not amplified. Wide excision, MammoSite.    Past Surgical History  Procedure Laterality Date  . Tubal ligation    . [place]  [place] ([place]    6 mm hyperplastic polyp, internal hemorrhoids  . Esophagogastroduodenoscopy  06/[place]/2008 ([place]    reflux esophagitis, gastritis  . Dilation and curettage of uterus      X 2  . Tonsillectomy      AGE 37  . Breast lumpectomy with sentinel lymph node biopsy Left 09/19/2015    Procedure: BREAST LUMPECTOMY WITH SENTINEL LYMPH NODE BX;  Surgeon: Robert Bellow, MD;  Location: [facility] ORS;  Service: General;  Laterality: Left;  . Axillary lymph node biopsy Left 09/19/2015    Procedure: AXILLARY LYMPH NODE BIOPSY;  Surgeon: Robert Bellow, MD;  Location: [facility] OR[facility];  Service: General;  Laterality: Left;    Family History  Problem Relation Age of Onset  . Heart disease Mother   . Hypertension Mother   . Cancer Father     stomach  . Stomach cancer Father 91  . Colon cancer Father     Social History   Social History  . Marital Status: Single    Spouse Name: N/A  . Number of Children: N/A  . Years of Education: N/A   Occupational History  . Not on file.   Social History Main Topics  . Smoking status: Former Smoker -- 25 years    Types: Cigarettes  . Smokeless tobacco: Never Used     Comment: PT  SMOKES 1 CIG MAYBE EVERY WEEK WITH THE HELP OF CHANTIX BUT PRIOR TO THAT PT WAS SMOKING 1PPD  . Alcohol Use: 1.0 oz/week    2 Standard drinks or equivalent per week     Comment: OCC  . Drug Use: No  . Sexual Activity: No   Other Topics Concern  . Not on file   Social History Narrative   Review of Systems No rash No vomiting or diarrhea Appetite is okay    Objective:   Physical Exam  Constitutional: She appears well-developed and well-nourished. No distress.  HENT:  Mouth/Throat: Oropharynx is clear and moist. No oropharyngeal exudate.  Mild maxillary tenderness Moderate nasal inflammation TMs normal  Neck: Normal range of motion. Neck supple. No thyromegaly present.  Pulmonary/Chest: Effort normal and breath sounds normal. No respiratory distress. She has no wheezes. She has no rales.  Lymphadenopathy:    She has no cervical adenopathy.          Assessment & Plan:

## 2015-12-13 NOTE — Assessment & Plan Note (Signed)
Discussed concerns with levaquin Will try augmentin Prednisone for 10 days Diflucan for prn

## 2015-12-13 NOTE — Progress Notes (Signed)
Radiation Oncology Follow up Note  Name: Connie Maldonado   Date:   12/13/2015 MRN:  [id number] DOB: July 06, 1956    This 60 y.o. female presents to the clinic today for one-month follow-up status post accelerated partial breast irradiation for stage I invasive mammary carcinoma.  REFERRING PROVIDER: Kirk Ruths, MD  HPI: Patient is a 60 year old female now 1 month out having completed accelerated partial breast radiation to the left breast for a T1 1 N0 M0 ER/PR positive HER-2/neu negative invasive mammary carcinoma. She is seen today in routine follow-up and is doing well. She specifically denies breast tenderness cough or bone pain.. She's been started on Femara tolerating that well without side effect.  COMPLICATIONS OF TREATMENT: none  FOLLOW UP COMPLIANCE: keeps appointments   PHYSICAL EXAM:  BP 154/72 mmHg  Pulse 92  Temp(Src) 98.2 F (36.8 C)  Resp 20  Ht '5\' 7"'$  (1.702 m)  Wt 208 lb 10.7 oz (94.65 kg)  BMI 32.67 kg/m2 Lungs are clear to A&P cardiac examination essentially unremarkable with regular rate and rhythm. No dominant mass or nodularity is noted in either breast in 2 positions examined. Incision is well-healed. No axillary or supraclavicular adenopathy is appreciated. Cosmetic result is excellent. Well-developed well-nourished patient in NAD. HEENT reveals PERLA, EOMI, discs not visualized.  Oral cavity is clear. No oral mucosal lesions are identified. Neck is clear without evidence of cervical or supraclavicular adenopathy. Lungs are clear to A&P. Cardiac examination is essentially unremarkable with regular rate and rhythm without murmur rub or thrill. Abdomen is benign with no organomegaly or masses noted. Motor sensory and DTR levels are equal and symmetric in the upper and lower extremities. Cranial nerves II through XII are grossly intact. Proprioception is intact. No peripheral adenopathy or edema is identified. No motor or sensory levels are noted. Crude visual  fields are within normal range.  RADIOLOGY RESULTS: No current films for review  PLAN: At the present time patient is doing well 1 month out from accelerated partial breast radiation. I'm please were overall progress. She is tolerating Femara well without side effect. She is moving to [place] area although will maintain follow-up care in our department. Patient knows to call sooner with any concerns. I have set her up for a six-month follow-up.  I would like to take this opportunity to thank you for allowing me to participate in the care of your patient.Armstead Peaks., MD

## 2015-12-13 NOTE — Progress Notes (Signed)
[facility] day:  12/13/2015  Chief Complaint: Connie Maldonado is a 60 y.o. female with stage I left breast cancer who is seen for review of interval CT scan and assessment 1 month after initiation of Femara.  HPI:  The patient was last seen in the medical oncology on 11/08/2015.  At that time, she felt good.  She noted some hot flashes.   LFTs were elevated (AST 100 and ALT 66).  Review of her chart indicated a history of elevated liver function tests for 5 years.  Etiology was unclear.  We discussed baseline liver imaging unless already performed.  She began Femara.  Abdominal CT on 12/04/2015 revealed hepatomegaly and mild hepatic steatosis.  There was no acute process or evidence of metastatic disease in the abdomen.  There was left nephrolithiasis and advanced coronary artery atherosclerosis  Symptomatically, she feels "ok".  She has had some mild hot flashes and night sweats.  She is taking her Femara.  She is moving to [place] [place].  She notes that she wishes to continue her care here.    Past Medical History  Diagnosis Date  . GERD (gastroesophageal reflux disease)   . Hyperlipidemia   . Hypertension   . Anxiety   . Diabetes mellitus without complication ([place])   . Sleep apnea     + SLEEP STUDY 2 YEARS AGO BUT PT STATES NO ONE EVER HELPED HER GET A CPAP MACHINE   . Lumbar pain   . Anemia   . Cancer ([place])   . Breast cancer of upper-outer quadrant of left female breast ([place]) 09/19/2015    4 mm, T1a, N0l ER positive, PR positive, HER-2/neu not amplified. Wide excision, MammoSite.    Past Surgical History  Procedure Laterality Date  . Tubal ligation    . [place]  [place]    6 mm hyperplastic polyp, internal hemorrhoids  . Esophagogastroduodenoscopy  06[place]/2008 ([place]    reflux esophagitis, gastritis  . Dilation and curettage of uterus      X 2  . Tonsillectomy      AGE 29  . Breast lumpectomy with  sentinel lymph node biopsy Left 09/19/2015    Procedure: BREAST LUMPECTOMY WITH SENTINEL LYMPH NODE BX;  Surgeon: Robert Bellow, MD;  Location: [facility] OR[facility];  Service: General;  Laterality: Left;  . Axillary lymph node biopsy Left 09/19/2015    Procedure: AXILLARY LYMPH NODE BIOPSY;  Surgeon: Robert Bellow, MD;  Location: [facility];  Service: General;  Laterality: Left;    Family History  Problem Relation Age of Onset  . Heart disease Mother   . Hypertension Mother   . Cancer Father     stomach  . Stomach cancer Father 40  . Colon cancer Father     Social History:  reports that she has quit smoking. Her smoking use included Cigarettes. She quit after 25 years of use. She has never used smokeless tobacco. She reports that she drinks about 1.0 oz of alcohol per week. She reports that she does not use illicit drugs.  She is from [place].  She moved to [place] in 2010.  She is in the process of moving to [employer] [facility] [place], [place], to be with her son and grand-daughter.  She plans on keeping her physicians in the [place] area despite her move.  The patient is alone today.  Allergies: No Known Allergies  Current Medications: Current Outpatient Prescriptions  Medication Sig  Dispense Refill  . ALPRAZolam (XANAX) 0.25 MG tablet TAKE ONE TABLET BY MOUTH 3 TIMES DAILY AS NEEDED (Patient taking differently: as needed. TAKE ONE TABLET BY MOUTH 3 TIMES DAILY AS NEEDED) 90 tablet 0  . amLODipine (NORVASC) 10 MG tablet Take 1 tablet (10 mg total) by mouth every morning. 90 tablet 3  . atorvastatin (LIPITOR) 40 MG tablet Take 40 mg by mouth every morning.    . Calcium Carb-Cholecalciferol (CALCIUM 600 + D PO) Take 1 tablet by mouth daily.    . CHANTIX 1 MG tablet TAKE ONE TABLET TWICE DAILY 56 tablet 3  . esomeprazole (NEXIUM) 40 MG capsule Take 1 capsule (40 mg total) by mouth daily. 30 capsule 3  . HYDROcodone-acetaminophen (VICODIN) 5-500 MG per tablet Take 1 tablet by mouth  every 6 (six) hours as needed. Reported on 09/26/2015    . letrozole (FEMARA) 2.5 MG tablet Take 1 tablet (2.5 mg total) by mouth daily. 90 tablet 0  . metaxalone (SKELAXIN) 800 MG tablet daily as needed. Take one by mouth at bedtime    . metFORMIN (GLUCOPHAGE) 500 MG tablet TAKE ONE TABLET TWICE DAILY WITH MEALS 180 tablet 0  . Multiple Vitamin (MULTIVITAMIN) tablet Take 1 tablet by mouth daily.    . valsartan (DIOVAN) 160 MG tablet Take 1 tablet (160 mg total) by mouth every morning. 90 tablet 3  . vitamin C (ASCORBIC ACID) 500 MG tablet Take 500 mg by mouth daily. Reported on 11/08/2015    . Zinc 50 MG TABS Take 1 tablet by mouth daily. Reported on 11/08/2015    . zolpidem (AMBIEN CR) 6.25 MG CR tablet TAKE 1 TO 2 TABLETS AT BEDTIME AS NEEDED 60 tablet 0  . amoxicillin-clavulanate (AUGMENTIN) 875-125 MG tablet Take 1 tablet by mouth 2 (two) times daily. 20 tablet 1  . fluconazole (DIFLUCAN) 150 MG tablet Take 1 tablet (150 mg total) by mouth once. 2 tablet 1  . predniSONE (DELTASONE) 20 MG tablet Take 2 tablets (40 mg total) by mouth daily. For 5 days, then 1 tab daily for 5 days. 15 tablet 0   No current facility-administered medications for this visit.    Review of Systems:  GENERAL:  Feels "alright".  No fevers, sweats or weight loss. PERFORMANCE STATUS (ECOG):  0 HEENT:  No visual changes, runny nose, sore throat, mouth sores or tenderness. Lungs: No shortness of breath or cough.  No hemoptysis. Cardiac:  No chest pain, palpitations, orthopnea, or PND. GI:  No nausea, vomiting, diarrhea, constipation, melena or hematochezia. GU:  No urgency, frequency, dysuria, or hematuria. Musculoskeletal:  No back pain.  No joint pain.  No muscle tenderness. Extremities:  No pain or swelling. Skin:  No rashes or skin changes. Neuro:  No headache, numbness or weakness, balance or coordination issues. Endocrine:  Diabetes.  No thyroid issues, hot flashes or night sweats. Psych:  No mood changes,  depression or anxiety. Pain:  No focal pain. Review of systems:  All other systems reviewed and found to be negative.  Physical Exam: Blood pressure 154/72, pulse 92, temperature 98.2 F (36.8 C), temperature source Tympanic, weight 208 lb 10.7 oz (94.65 kg). GENERAL:  Well developed, well nourished, sitting comfortably in the exam room in no acute distress. MENTAL STATUS:  Alert and oriented to person, place and time. HEAD:  Short styled frosted hair.  Normocephalic, atraumatic, face symmetric, no Cushingoid features. EYES:  Blue eyes.  Pupils equal round and reactive to light and accomodation.  No conjunctivitis  or scleral icterus. ENT:  Oropharynx clear without lesion.  Tongue normal. Mucous membranes moist.  RESPIRATORY:  Clear to auscultation without rales, wheezes or rhonchi. CARDIOVASCULAR:  Regular rate and rhythm without murmur, rub or gallop. ABDOMEN:  Soft, non-tender, with active bowel sounds, and no appreciable hepatosplenomegaly.  No masses. SKIN:  No rashes, ulcers or lesions. EXTREMITIES: No edema, no skin discoloration or tenderness.  No palpable cords. LYMPH NODES: No palpable cervical, supraclavicular, axillary or inguinal adenopathy  NEUROLOGICAL: Unremarkable. PSYCH:  Appropriate.  No visits with results within 3 Day(s) from this visit. Latest known visit with results is:  Appointment on 12/04/2015  Component Date Value Ref Range Status  . Total Protein 12/04/2015 7.4  6.5 - 8.1 g/dL Final  . Albumin 46/52/0761 3.7  3.5 - 5.0 g/dL Final  . AST 91/55/0271 71* 15 - 41 U/L Final  . ALT 12/04/2015 51  14 - 54 U/L Final  . Alkaline Phosphatase 12/04/2015 77  38 - 126 U/L Final  . Total Bilirubin 12/04/2015 0.6  0.3 - 1.2 mg/dL Final  . Bilirubin, Direct 12/04/2015 0.1  0.1 - 0.5 mg/dL Final  . Indirect Bilirubin 12/04/2015 0.5  0.3 - 0.9 mg/dL Final  . Hepatitis B Surface Ag 12/04/2015 Negative  Negative Final   Comment: (NOTE) Performed At: [facility] [address] [place], [place] [id number] Mila Homer MD LT:[id number]   . Hep B Core Total Ab 12/04/2015 Negative  Negative Final   Comment: (NOTE) Performed At: [facility] [address] [place], [place] [id number] Mila Homer MD SF:[id number]   . HCV Ab 12/04/2015 <0.1  0.0 - 0.9 s/co ratio Final   Comment: (NOTE)                                  Negative:     < 0.8                             Indeterminate: 0.8 - 0.9                                  Positive:     > 0.9 The CDC recommends that a positive HCV antibody result be followed up with a HCV Nucleic Acid Amplification test (056788). Performed At: [facility] [address] [place], [place] [id number] Mila Homer MD AO:[id number]     Assessment:  Porcia Morganti is a 60 y.o. female with stage IA left breast cancer s/p wide excision and sentinel lymph node exploration on 09/19/2015.  Pathology revealed a 4 mm  grade I invasive mammary carcinoma.  Margins were negative.  No sentinel lymph node was retrieved.  Tumor was ER > 90%, PR > 90%, and Her2/neu 1+.   Pathologic stage was T1aNxMx.  She completed Mammosite radiation on 10/26/2015.  CA27.29 was 20.2 on 11/08/2015.  Bone density study on 10/17/2015 revealed osteopenia with a T-score of -1.8 in the left femoral neck.    She has a history of elevated liver function tests.  Abdominal CT on 12/04/2015 revealed hepatomegaly and mild hepatic steatosis.  There was no acute process or evidence of metastatic disease in the abdomen.  Hepatitis B and C testing was negative on 12/04/2015.  She began Femara on 11/08/2015.  She is tolerating it well.  Symptomatically,  she is doing well.  She has mild vasomotor symptoms on Femara.  Exam is stable.  Plan: 1.  Labs today:  LFTs. 2.  Review abdominal CT scan in detail.  Discussed steatosis, left nephrolithiasis and coronary artery disease. 3.  Continue Femara. 4.  RTC in 3 months for MD  assessment and labs (CBC with diff, CMP, CA27.29).   Lequita Asal, MD  12/13/2015, 11:10 AM

## 2015-12-15 ENCOUNTER — Encounter: Payer: Self-pay | Admitting: Hematology and Oncology

## 2015-12-18 NOTE — Telephone Encounter (Signed)
Pt returned my call and stated that she would like the CPAP order sent to [facility], [employer]. Message sent to Dr. Ashby Dawes for pressure recommendation.  [employer] will allow pt to obtain CPAP off that 2013 Sleep Study scanned into Epic. Please advise and send phone message back to [facility] to place referral. [name]

## 2015-12-18 NOTE — Telephone Encounter (Signed)
Per Melissa with [facility] the sleep study should be good for 5 years with [employer]. Also contacted [facility] and they stated Study good for 5 yrs. Called and left this message on VM for pt to return my call to advise what DME company she would like to use. Waiting on patient's return call. [name]

## 2015-12-20 ENCOUNTER — Telehealth: Payer: Self-pay

## 2015-12-20 MED ORDER — CLINDAMYCIN HCL 300 MG PO CAPS: 300.0000 mg | ORAL_CAPSULE | Freq: Three times a day (TID) | ORAL | Status: DC

## 2015-12-20 NOTE — Telephone Encounter (Signed)
Pt left v/m; pt was seen 12/13/15 and sinus condition has worsened; more mucus in back of throat, h/a for 2 days. Pt does not think Augmentin is working. Pt request cb.

## 2015-12-20 NOTE — Telephone Encounter (Signed)
Please let her know that I sent a different antibiotic for her. I didn't use the levaquin because she already had that a month ago. If her symptoms aren't improving by next week, evaluation by an ENT would probably be a good idea. She should stop the augmentin

## 2015-12-20 NOTE — Telephone Encounter (Signed)
Spoke to pt. I advised her if she is not seeing any improvement by Monday, call us back so we can start a referral.

## 2015-12-23 NOTE — Telephone Encounter (Signed)
She should undergo an in-lab titration study if covered by insurance, otherwise she can undergo an auto-PAP 5-20 study.

## 2015-12-24 NOTE — Telephone Encounter (Signed)
Order placed for CPAP.

## 2015-12-24 NOTE — Telephone Encounter (Signed)
Spoke with patient and she does not want to have another Sleep Study if she can help it.  Please set patient up on an Auto CPAP Range 5-20 cm H20, per DR. [name]

## 2015-12-31 ENCOUNTER — Other Ambulatory Visit: Payer: Self-pay | Admitting: Family Medicine

## 2016-01-05 ENCOUNTER — Other Ambulatory Visit: Payer: Self-pay | Admitting: Hematology and Oncology

## 2016-01-07 ENCOUNTER — Other Ambulatory Visit: Payer: Self-pay | Admitting: Hematology and Oncology

## 2016-01-15 ENCOUNTER — Other Ambulatory Visit: Payer: Self-pay | Admitting: Family Medicine

## 2016-01-15 NOTE — Telephone Encounter (Signed)
Pt has not had f/u since establishing care 09/2015

## 2016-01-16 NOTE — Telephone Encounter (Signed)
Rx called in to requested pharmacy 

## 2016-01-23 ENCOUNTER — Telehealth: Payer: Self-pay | Admitting: Internal Medicine

## 2016-01-23 NOTE — Telephone Encounter (Signed)
Just FYI Pt returned my call to schedule a F/U appointment for compliance on her CPAP.  Pt stated that she turned the CPAP Machine back in to DME. Pt stated that she was unable to wear CPAP, b/c mask was too hot and she was unable to tolerate CPAP.  [name]

## 2016-01-23 NOTE — Telephone Encounter (Signed)
Ok, pls cancel fu appt, pt may call and reschedule if she is interested in trying again in the future.

## 2016-01-24 NOTE — Telephone Encounter (Signed)
Pt informed. Nothing further needed. 

## 2016-03-13 ENCOUNTER — Other Ambulatory Visit: Payer: Self-pay | Admitting: *Deleted

## 2016-03-13 ENCOUNTER — Inpatient Hospital Stay (HOSPITAL_BASED_OUTPATIENT_CLINIC_OR_DEPARTMENT_OTHER): Payer: BLUE CROSS/BLUE SHIELD | Admitting: Hematology and Oncology

## 2016-03-13 ENCOUNTER — Inpatient Hospital Stay: Payer: BLUE CROSS/BLUE SHIELD | Attending: Hematology and Oncology

## 2016-03-13 ENCOUNTER — Encounter: Payer: Self-pay | Admitting: Hematology and Oncology

## 2016-03-13 VITALS — BP 137/77 | HR 101 | Temp 97.7°F | Resp 18 | Wt 203.9 lb

## 2016-03-13 DIAGNOSIS — K5730 Diverticulosis of large intestine without perforation or abscess without bleeding: Secondary | ICD-10-CM | POA: Diagnosis not present

## 2016-03-13 DIAGNOSIS — E785 Hyperlipidemia, unspecified: Secondary | ICD-10-CM | POA: Insufficient documentation

## 2016-03-13 DIAGNOSIS — K760 Fatty (change of) liver, not elsewhere classified: Secondary | ICD-10-CM | POA: Insufficient documentation

## 2016-03-13 DIAGNOSIS — Z170 Estrogen receptor positive status [ER+]: Secondary | ICD-10-CM

## 2016-03-13 DIAGNOSIS — M8580 Other specified disorders of bone density and structure, unspecified site: Secondary | ICD-10-CM | POA: Insufficient documentation

## 2016-03-13 DIAGNOSIS — D127 Benign neoplasm of rectosigmoid junction: Secondary | ICD-10-CM

## 2016-03-13 DIAGNOSIS — I10 Essential (primary) hypertension: Secondary | ICD-10-CM

## 2016-03-13 DIAGNOSIS — Z7984 Long term (current) use of oral hypoglycemic drugs: Secondary | ICD-10-CM

## 2016-03-13 DIAGNOSIS — Z800 Family history of malignant neoplasm of digestive organs: Secondary | ICD-10-CM | POA: Insufficient documentation

## 2016-03-13 DIAGNOSIS — R160 Hepatomegaly, not elsewhere classified: Secondary | ICD-10-CM | POA: Diagnosis not present

## 2016-03-13 DIAGNOSIS — Z87891 Personal history of nicotine dependence: Secondary | ICD-10-CM | POA: Insufficient documentation

## 2016-03-13 DIAGNOSIS — M545 Low back pain: Secondary | ICD-10-CM

## 2016-03-13 DIAGNOSIS — Z79811 Long term (current) use of aromatase inhibitors: Secondary | ICD-10-CM | POA: Insufficient documentation

## 2016-03-13 DIAGNOSIS — E119 Type 2 diabetes mellitus without complications: Secondary | ICD-10-CM

## 2016-03-13 DIAGNOSIS — G4730 Sleep apnea, unspecified: Secondary | ICD-10-CM | POA: Insufficient documentation

## 2016-03-13 DIAGNOSIS — K219 Gastro-esophageal reflux disease without esophagitis: Secondary | ICD-10-CM | POA: Insufficient documentation

## 2016-03-13 DIAGNOSIS — C50412 Malignant neoplasm of upper-outer quadrant of left female breast: Secondary | ICD-10-CM | POA: Diagnosis not present

## 2016-03-13 DIAGNOSIS — Z79899 Other long term (current) drug therapy: Secondary | ICD-10-CM | POA: Insufficient documentation

## 2016-03-13 DIAGNOSIS — N951 Menopausal and female climacteric states: Secondary | ICD-10-CM

## 2016-03-13 DIAGNOSIS — F419 Anxiety disorder, unspecified: Secondary | ICD-10-CM | POA: Insufficient documentation

## 2016-03-13 LAB — CBC WITH DIFFERENTIAL/PLATELET
Basophils Absolute: 0.1 10*3/uL (ref 0–0.1)
Basophils Relative: 1 %
Eosinophils Absolute: 0.3 10*3/uL (ref 0–0.7)
Eosinophils Relative: 3 %
HCT: 34.4 % — ABNORMAL LOW (ref 35.0–47.0)
Hemoglobin: 11.3 g/dL — ABNORMAL LOW (ref 12.0–16.0)
Lymphocytes Relative: 26 %
Lymphs Abs: 3 10*3/uL (ref 1.0–3.6)
MCH: 27.2 pg (ref 26.0–34.0)
MCHC: 32.9 g/dL (ref 32.0–36.0)
MCV: 82.8 fL (ref 80.0–100.0)
Monocytes Absolute: 0.7 10*3/uL (ref 0.2–0.9)
Monocytes Relative: 6 %
Neutro Abs: 7.4 10*3/uL — ABNORMAL HIGH (ref 1.4–6.5)
Neutrophils Relative %: 64 %
Platelets: 296 10*3/uL (ref 150–440)
RBC: 4.16 MIL/uL (ref 3.80–5.20)
RDW: 16 % — ABNORMAL HIGH (ref 11.5–14.5)
WBC: 11.5 10*3/uL — ABNORMAL HIGH (ref 3.6–11.0)

## 2016-03-13 LAB — COMPREHENSIVE METABOLIC PANEL
ALT: 75 U/L — ABNORMAL HIGH (ref 14–54)
AST: 104 U/L — ABNORMAL HIGH (ref 15–41)
Albumin: 4 g/dL (ref 3.5–5.0)
Alkaline Phosphatase: 84 U/L (ref 38–126)
Anion gap: 9 (ref 5–15)
BUN: 7 mg/dL (ref 6–20)
CO2: 25 mmol/L (ref 22–32)
Calcium: 9 mg/dL (ref 8.9–10.3)
Chloride: 100 mmol/L — ABNORMAL LOW (ref 101–111)
Creatinine, Ser: 0.63 mg/dL (ref 0.44–1.00)
GFR calc Af Amer: >60 mL/min (ref >60)
GFR calc non Af Amer: >60 mL/min (ref >60)
Glucose, Bld: 207 mg/dL — ABNORMAL HIGH (ref 65–99)
Potassium: 3.5 mmol/L (ref 3.5–5.1)
Sodium: 134 mmol/L — ABNORMAL LOW (ref 135–145)
Total Bilirubin: 0.5 mg/dL (ref 0.3–1.2)
Total Protein: 7.6 g/dL (ref 6.5–8.1)

## 2016-03-13 NOTE — Progress Notes (Signed)
Patient is here for follow up, she has no complaints  

## 2016-03-13 NOTE — Progress Notes (Signed)
[facility]  Clinic day:  03/13/2016   Chief Complaint: Connie Maldonado is a 60 y.o. female with stage I left breast cancer who is seen 3 month assessment on Femara.  HPI:  The patient was last seen in the medical oncology on 12/13/2015.  At that time, she was doing well on Femara.  She had mild vasomotor symptoms.  Exam was stable.  Abdominal CT scan had revealed hepatic steatosis (form secondary to chronically elevated liver function tests).  Hepatitis serologies were negative.  During the interim, she has moved to [place]. She is doing well. She notes flashes and night sweats.  She is using black cohosh.    She stats that her diet is good.  He denies any melena or hematochezia. Her last colonoscopy was with Dr. Dow Adolph on 08/18/2014.  Study revealed diverticulosis in the sigmoid colon. There were three 2-4 mm polyps at the hepatic flexure and one 3 mm polyp polyp at the rectosigmoid colon.  Pathology revealed in the hepatic flexure with no high-grade dysplasia or malignancy.  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  There was no epithelial dysplasia.   Past Medical History:  Diagnosis Date  . Anemia   . Anxiety   . Breast cancer of upper-outer quadrant of left female breast (HCC) 09/19/2015   4 mm, T1a, N0l ER positive, PR positive, HER-2/neu not amplified. Wide excision, MammoSite.  . Cancer (HCC)   . Diabetes mellitus without complication (HCC)   . GERD (gastroesophageal reflux disease)   . Hyperlipidemia   . Hypertension   . Lumbar pain   . Sleep apnea    + SLEEP STUDY 2 YEARS AGO BUT PT STATES NO ONE EVER HELPED HER GET A CPAP MACHINE     Past Surgical History:  Procedure Laterality Date  . AXILLARY LYMPH NODE BIOPSY Left 09/19/2015   Procedure: AXILLARY LYMPH NODE BIOPSY;  Surgeon: Earline Mayotte, MD;  Location: [facility] ORS;  Service: General;  Laterality: Left;  . BREAST LUMPECTOMY WITH SENTINEL LYMPH NODE BIOPSY Left 09/19/2015   Procedure: BREAST LUMPECTOMY WITH SENTINEL LYMPH NODE BX;  Surgeon: Earline Mayotte, MD;  Location: [facility] ORS;  Service: General;  Laterality: Left;  . [place]  [place]   6 mm hyperplastic polyp, internal hemorrhoids  . DILATION AND CURETTAGE OF UTERUS     X 2  . ESOPHAGOGASTRODUODENOSCOPY  06/12/2008 ([place]   reflux esophagitis, gastritis  . TONSILLECTOMY     AGE 49  . TUBAL LIGATION      Family History  Problem Relation Age of Onset  . Heart disease Mother   . Hypertension Mother   . Cancer Father     stomach  . Stomach cancer Father 55  . Colon cancer Father     Social History:  reports that she has quit smoking. Her smoking use included Cigarettes. She quit after 25.00 years of use. She has never used smokeless tobacco. She reports that she drinks about 1.0 oz of alcohol per week . She reports that she does not use drugs.  She is from [place].  She moved to [place] in 2010.  She moved to [facility] [place], [place], to be with her son and grand-daughter.  She has kept her physicians in the [place] area despite her move.  The patient is alone today.  Allergies: No Known Allergies  Current Medications: Current Outpatient Prescriptions  Medication Sig Dispense Refill  . ALPRAZolam (XANAX) 0.25 MG tablet TAKE ONE  TABLET BY MOUTH 3 TIMES DAILY AS NEEDED 30 tablet 0  . amLODipine (NORVASC) 10 MG tablet Take 1 tablet (10 mg total) by mouth every morning. 90 tablet 3  . atorvastatin (LIPITOR) 40 MG tablet Take 40 mg by mouth every morning.    . Calcium Carb-Cholecalciferol (CALCIUM 600 + D PO) Take 1 tablet by mouth daily.    . CHANTIX 1 MG tablet TAKE ONE TABLET TWICE DAILY 56 tablet 3  . clindamycin (CLEOCIN) 300 MG capsule Take 1 capsule (300 mg total) by mouth 3 (three) times daily. 30 capsule 0  . esomeprazole (NEXIUM) 40 MG capsule TAKE 1 CAPSULE EVERY DAY 90 capsule 2  . fluconazole (DIFLUCAN) 150 MG tablet Take 1 tablet (150 mg total) by mouth  once. 2 tablet 1  . HYDROcodone-acetaminophen (VICODIN) 5-500 MG per tablet Take 1 tablet by mouth every 6 (six) hours as needed. Reported on 09/26/2015    . letrozole (FEMARA) 2.5 MG tablet TAKE ONE TABLET BY MOUTH EVERY DAY 90 tablet 1  . metaxalone (SKELAXIN) 800 MG tablet daily as needed. Take one by mouth at bedtime    . metFORMIN (GLUCOPHAGE) 500 MG tablet TAKE ONE TABLET TWICE DAILY WITH MEALS 180 tablet 0  . Multiple Vitamin (MULTIVITAMIN) tablet Take 1 tablet by mouth daily.    . predniSONE (DELTASONE) 20 MG tablet Take 2 tablets (40 mg total) by mouth daily. For 5 days, then 1 tab daily for 5 days. 15 tablet 0  . valsartan (DIOVAN) 160 MG tablet Take 1 tablet (160 mg total) by mouth every morning. 90 tablet 3  . vitamin C (ASCORBIC ACID) 500 MG tablet Take 500 mg by mouth daily. Reported on 11/08/2015    . Zinc 50 MG TABS Take 1 tablet by mouth daily. Reported on 11/08/2015    . zolpidem (AMBIEN CR) 6.25 MG CR tablet TAKE 1 TO 2 TABLETS AT BEDTIME AS NEEDED 60 tablet 0   No current facility-administered medications for this visit.     Review of Systems:  GENERAL:  Feels good.  No fevers or sweats.  Weight loss of 5 pounds. PERFORMANCE STATUS (ECOG):  0 HEENT:  No visual changes, runny nose, sore throat, mouth sores or tenderness. Lungs: No shortness of breath or cough.  No hemoptysis. Cardiac:  No chest pain, palpitations, orthopnea, or PND. GI:  No nausea, vomiting, diarrhea, constipation, melena or hematochezia. GU:  No urgency, frequency, dysuria, or hematuria. Musculoskeletal:  No back pain.  No joint pain.  No muscle tenderness. Extremities:  No pain or swelling. Skin:  No rashes or skin changes. Neuro:  No headache, numbness or weakness, balance or coordination issues. Endocrine:  Diabetes.  No thyroid issues.  Hot flashes and night sweats, mild. Psych:  No mood changes, depression or anxiety. Pain:  No focal pain. Review of systems:  All other systems reviewed and found to  be negative.  Physical Exam: Blood pressure 137/77, pulse (!) 101, temperature 97.7 F (36.5 C), temperature source Tympanic, resp. rate 18, weight 203 lb 14.8 oz (92.5 kg). GENERAL:  Well developed, well nourished, woman sitting comfortably in the exam room in no acute distress. MENTAL STATUS:  Alert and oriented to person, place and time. HEAD:  Short styled frosted hair.  Normocephalic, atraumatic, face symmetric, no Cushingoid features. EYES:  Black rimmed glasses.  Blue eyes.  Pupils equal round and reactive to light and accomodation.  No conjunctivitis or scleral icterus. ENT:  Oropharynx clear without lesion.  Tongue normal. Mucous  membranes moist.  RESPIRATORY:  Clear to auscultation without rales, wheezes or rhonchi. CARDIOVASCULAR:  Regular rate and rhythm without murmur, rub or gallop. BREAST:  Right breast without masses, skin changes or nipple discharge.  Left breast s/p wide excision with post operative changes.  No skin changes or nipple discharge ABDOMEN:  Soft, non-tender, with active bowel sounds, and no appreciable hepatosplenomegaly.  No masses. SKIN:  No rashes, ulcers or lesions. EXTREMITIES: No edema, no skin discoloration or tenderness.  No palpable cords. LYMPH NODES: No palpable cervical, supraclavicular, axillary or inguinal adenopathy  NEUROLOGICAL: Unremarkable. PSYCH:  Appropriate.   Appointment on 03/13/2016  Component Date Value Ref Range Status  . WBC 03/13/2016 11.5* 3.6 - 11.0 K/uL Final  . RBC 03/13/2016 4.16  3.80 - 5.20 MIL/uL Final  . Hemoglobin 03/13/2016 11.3* 12.0 - 16.0 g/dL Final  . HCT 03/13/2016 34.4* 35.0 - 47.0 % Final  . MCV 03/13/2016 82.8  80.0 - 100.0 fL Final  . MCH 03/13/2016 27.2  26.0 - 34.0 pg Final  . MCHC 03/13/2016 32.9  32.0 - 36.0 g/dL Final  . RDW 03/13/2016 16.0* 11.5 - 14.5 % Final  . Platelets 03/13/2016 296  150 - 440 K/uL Final  . Neutrophils Relative % 03/13/2016 64  % Final  . Neutro Abs 03/13/2016 7.4* 1.4 - 6.5  K/uL Final  . Lymphocytes Relative 03/13/2016 26  % Final  . Lymphs Abs 03/13/2016 3.0  1.0 - 3.6 K/uL Final  . Monocytes Relative 03/13/2016 6  % Final  . Monocytes Absolute 03/13/2016 0.7  0.2 - 0.9 K/uL Final  . Eosinophils Relative 03/13/2016 3  % Final  . Eosinophils Absolute 03/13/2016 0.3  0 - 0.7 K/uL Final  . Basophils Relative 03/13/2016 1  % Final  . Basophils Absolute 03/13/2016 0.1  0 - 0.1 K/uL Final  . Sodium 03/13/2016 134* 135 - 145 mmol/L Final  . Potassium 03/13/2016 3.5  3.5 - 5.1 mmol/L Final  . Chloride 03/13/2016 100* 101 - 111 mmol/L Final  . CO2 03/13/2016 25  22 - 32 mmol/L Final  . Glucose, Bld 03/13/2016 207* 65 - 99 mg/dL Final  . BUN 03/13/2016 7  6 - 20 mg/dL Final  . Creatinine, Ser 03/13/2016 0.63  0.44 - 1.00 mg/dL Final  . Calcium 03/13/2016 9.0  8.9 - 10.3 mg/dL Final  . Total Protein 03/13/2016 7.6  6.5 - 8.1 g/dL Final  . Albumin 03/13/2016 4.0  3.5 - 5.0 g/dL Final  . AST 03/13/2016 104* 15 - 41 U/L Final  . ALT 03/13/2016 75* 14 - 54 U/L Final  . Alkaline Phosphatase 03/13/2016 84  38 - 126 U/L Final  . Total Bilirubin 03/13/2016 0.5  0.3 - 1.2 mg/dL Final  . GFR calc non Af Amer 03/13/2016 >60  >60 mL/min Final  . GFR calc Af Amer 03/13/2016 >60  >60 mL/min Final   Comment: (NOTE) The eGFR has been calculated using the CKD EPI equation. This calculation has not been validated in all clinical situations. eGFR's persistently <60 mL/min signify possible Chronic Kidney Disease.   . Anion gap 03/13/2016 9  5 - 15 Final    Assessment:  Connie Maldonado is a 60 y.o. female with stage IA left breast cancer s/p wide excision and sentinel lymph node exploration on 09/19/2015.  Pathology revealed a 4 mm  grade I invasive mammary carcinoma.  Margins were negative.  No sentinel lymph node was retrieved.  Tumor was ER > 90%, PR > 90%, and  Her2/neu 1+.   Pathologic stage was T1aNxMx.  She completed Mammosite radiation on 10/26/2015.  CA27.29 was 20.2 on  11/08/2015.  Bone density study on 10/17/2015 revealed osteopenia with a T-score of -1.8 in the left femoral neck.    She has chronic elevated liver function tests.  Abdominal CT on 12/04/2015 revealed hepatomegaly and mild hepatic steatosis.  There was no acute process or evidence of metastatic disease in the abdomen.  Hepatitis B and C testing was negative on 12/04/2015.  She began Femara on 11/08/2015.  She has some mild vasomotor symptoms on Femara.  She is taking black cohosh (Actaea racemosa or Cimicifuga racemosa).  Symptomatically, she is doing well.  Exam is stable.  Plan: 1.  Labs today:  CBC with diff, CMP, CA27.29. 2.  Continue Femara. 3.  Consider switch to Effexor XR if vasomotor symptoms persist or progress.  Data on black cohosh (Actaea racemosa or Cimicifuga racemosa) suggests no increased risk of breast cancer recurrence in ER+ breast cancer (limited safety data).  Meta-analysis in 5 black cohosh trials found no adverse effect on liver function tests. 4.  RTC in 3 months for MD assessment and labs (CBC with diff, CMP, CA27.29).   Lequita Asal, MD  03/13/2016, 3:08 PM

## 2016-03-14 LAB — CANCER ANTIGEN 27.29: CA 27.29: 14.3 U/mL (ref 0.0–38.6)

## 2016-04-17 ENCOUNTER — Telehealth: Payer: Self-pay | Admitting: *Deleted

## 2016-04-17 NOTE — Telephone Encounter (Signed)
States she received a letter of reminder from [place] that it is time for her mammogram, She is asking if it is too early to have this done.

## 2016-04-18 NOTE — Telephone Encounter (Signed)
Call returned to patient and she was advised to speak with Dr Bary Castilla regarding this matter. She has agreed to call his office

## 2016-04-18 NOTE — Telephone Encounter (Signed)
  Please call patient.  I think it is too early for her mammogram.  I believe she is due next year.  Dr Bary Castilla typically orders them for his patients.  M

## 2016-04-30 ENCOUNTER — Encounter: Payer: Self-pay | Admitting: Internal Medicine

## 2016-05-09 ENCOUNTER — Encounter: Payer: Self-pay | Admitting: *Deleted

## 2016-06-15 NOTE — Progress Notes (Signed)
[facility] Clinic day:  06/16/2016   Chief Complaint: Connie Maldonado is a 60 y.o. female with stage I left breast cancer who is seen 3 month assessment on Femara.  HPI:  The patient was last seen in the medical oncology on 03/13/2016.  At that time, she was doing well on Femara.  She had mild vasomotor symptoms.  She was taking black cohosh (Actaea racemosa or Cimicifuga racemosa).  We discussed consideration of a switch to Effexor XR.  Symptomatically, she notes hot flashes "here and there".  She has night sweats once in awhile.  She is taking calcium and vitamin D.  She denies any breast concerns.   Past Medical History:  Diagnosis Date  . Anemia   . Anxiety   . Breast cancer of upper-outer quadrant of left female breast ([place]) 09/19/2015   4 mm, T1a, N0l ER positive, PR positive, HER-2/neu not amplified. Wide excision, MammoSite.  . Cancer ([place])   . Diabetes mellitus without complication ([place])   . GERD (gastroesophageal reflux disease)   . Hyperlipidemia   . Hypertension   . Lumbar pain   . Sleep apnea    + SLEEP STUDY 2 YEARS AGO BUT PT STATES NO ONE EVER HELPED HER GET A CPAP MACHINE     Past Surgical History:  Procedure Laterality Date  . AXILLARY LYMPH NODE BIOPSY Left 09/19/2015   Procedure: AXILLARY LYMPH NODE BIOPSY;  Surgeon: Robert Bellow, MD;  Location: [facility] ORS;  Service: General;  Laterality: Left;  . BREAST LUMPECTOMY WITH SENTINEL LYMPH NODE BIOPSY Left 09/19/2015   Procedure: BREAST LUMPECTOMY WITH SENTINEL LYMPH NODE BX;  Surgeon: Robert Bellow, MD;  Location: [facility] ORS;  Service: General;  Laterality: Left;  . [place]  [place] ([place]   6 mm hyperplastic polyp, internal hemorrhoids  . DILATION AND CURETTAGE OF UTERUS     X 2  . ESOPHAGOGASTRODUODENOSCOPY  06/12/2008 ([place]   reflux esophagitis, gastritis  . TONSILLECTOMY     AGE 2  . TUBAL LIGATION      Family History  Problem Relation Age of Onset  . Heart  disease Mother   . Hypertension Mother   . Cancer Father     stomach  . Stomach cancer Father 21  . Colon cancer Father     Social History:  reports that she has quit smoking. Her smoking use included Cigarettes. She quit after 25.00 years of use. She has never used smokeless tobacco. She reports that she drinks about 1.0 oz of alcohol per week . She reports that she does not use drugs.  She is from [place].  She moved to [place] in 2010.  She moved to [facility] [place], [place], to be with her son and grand-daughter.  Travel time is 3 hours.  She has kept her physicians in the [place] area despite her move.  The patient is alone today.  Allergies: No Known Allergies  Current Medications: Current Outpatient Prescriptions  Medication Sig Dispense Refill  . ALPRAZolam (XANAX) 0.25 MG tablet TAKE ONE TABLET BY MOUTH 3 TIMES DAILY AS NEEDED 30 tablet 0  . amLODipine (NORVASC) 10 MG tablet Take 1 tablet (10 mg total) by mouth every morning. 90 tablet 3  . atorvastatin (LIPITOR) 40 MG tablet Take 40 mg by mouth every morning.    . Calcium Carb-Cholecalciferol (CALCIUM 600 + D PO) Take 1 tablet by mouth daily.    . CHANTIX 1 MG tablet TAKE  ONE TABLET TWICE DAILY 56 tablet 3  . esomeprazole (NEXIUM) 40 MG capsule TAKE 1 CAPSULE EVERY DAY 90 capsule 2  . fluconazole (DIFLUCAN) 150 MG tablet Take 1 tablet (150 mg total) by mouth once. 2 tablet 1  . HYDROcodone-acetaminophen (VICODIN) 5-500 MG per tablet Take 1 tablet by mouth every 6 (six) hours as needed. Reported on 09/26/2015    . letrozole (FEMARA) 2.5 MG tablet TAKE ONE TABLET BY MOUTH EVERY DAY 90 tablet 1  . metaxalone (SKELAXIN) 800 MG tablet daily as needed. Take one by mouth at bedtime    . metFORMIN (GLUCOPHAGE) 500 MG tablet TAKE ONE TABLET TWICE DAILY WITH MEALS 180 tablet 0  . Multiple Vitamin (MULTIVITAMIN) tablet Take 1 tablet by mouth daily.    . valsartan (DIOVAN) 160 MG tablet Take 1 tablet (160 mg total) by  mouth every morning. 90 tablet 3  . vitamin C (ASCORBIC ACID) 500 MG tablet Take 500 mg by mouth daily. Reported on 11/08/2015    . Zinc 50 MG TABS Take 1 tablet by mouth daily. Reported on 11/08/2015    . zolpidem (AMBIEN CR) 6.25 MG CR tablet TAKE 1 TO 2 TABLETS AT BEDTIME AS NEEDED 60 tablet 0   No current facility-administered medications for this visit.     Review of Systems:  GENERAL:  Feels good.  No fevers or sweats.  Weight loss of 6 pounds. PERFORMANCE STATUS (ECOG):  0 HEENT:  No visual changes, runny nose, sore throat, mouth sores or tenderness. Lungs: No shortness of breath or cough.  No hemoptysis. Cardiac:  No chest pain, palpitations, orthopnea, or PND. GI:  No nausea, vomiting, diarrhea, constipation, melena or hematochezia. GU:  No urgency, frequency, dysuria, or hematuria. Musculoskeletal:  No back pain.  No joint pain.  No muscle tenderness. Extremities:  No pain or swelling. Skin:  No rashes or skin changes. Neuro:  No headache, numbness or weakness, balance or coordination issues. Endocrine:  Diabetes.  No thyroid issues.  Hot flashes here and there.  Once in awhile night sweats. Psych:  No mood changes, depression or anxiety. Pain:  No focal pain. Review of systems:  All other systems reviewed and found to be negative.  Physical Exam: Blood pressure (!) 148/78, pulse (!) 106, temperature 98 F (36.7 C), temperature source Tympanic, resp. rate 18, weight 197 lb 8.5 oz (89.6 kg). GENERAL:  Well developed, well nourished, woman sitting comfortably in the exam room in no acute distress. MENTAL STATUS:  Alert and oriented to person, place and time. HEAD:  Short styled frosted hair.  Normocephalic, atraumatic, face symmetric, no Cushingoid features. EYES:  Black rimmed glasses.  Blue eyes.  Pupils equal round and reactive to light and accomodation.  No conjunctivitis or scleral icterus. ENT:  Oropharynx clear without lesion.  Tongue normal. Mucous membranes moist.   RESPIRATORY:  Clear to auscultation without rales, wheezes or rhonchi. CARDIOVASCULAR:  Regular rate and rhythm without murmur, rub or gallop. BREAST:  Right breast without masses, skin changes or nipple discharge.  Left breast s/p wide excision with post operative changes at the 3 o'clock position.  No skin changes or nipple discharge ABDOMEN:  Soft, non-tender, with active bowel sounds, and no appreciable hepatosplenomegaly.  No masses. SKIN:  No rashes, ulcers or lesions. EXTREMITIES: No edema, no skin discoloration or tenderness.  No palpable cords. LYMPH NODES: No palpable cervical, supraclavicular, axillary or inguinal adenopathy  NEUROLOGICAL: Unremarkable. PSYCH:  Appropriate.   Appointment on 06/16/2016  Component Date Value  Ref Range Status  . WBC 06/16/2016 9.3  3.6 - 11.0 K/uL Final  . RBC 06/16/2016 4.07  3.80 - 5.20 MIL/uL Final  . Hemoglobin 06/16/2016 10.9* 12.0 - 16.0 g/dL Final  . HCT 06/16/2016 32.8* 35.0 - 47.0 % Final  . MCV 06/16/2016 80.7  80.0 - 100.0 fL Final  . MCH 06/16/2016 26.7  26.0 - 34.0 pg Final  . MCHC 06/16/2016 33.0  32.0 - 36.0 g/dL Final  . RDW 06/16/2016 16.5* 11.5 - 14.5 % Final  . Platelets 06/16/2016 260  150 - 440 K/uL Final  . Neutrophils Relative % 06/16/2016 68  % Final  . Neutro Abs 06/16/2016 6.3  1.4 - 6.5 K/uL Final  . Lymphocytes Relative 06/16/2016 21  % Final  . Lymphs Abs 06/16/2016 2.0  1.0 - 3.6 K/uL Final  . Monocytes Relative 06/16/2016 7  % Final  . Monocytes Absolute 06/16/2016 0.7  0.2 - 0.9 K/uL Final  . Eosinophils Relative 06/16/2016 3  % Final  . Eosinophils Absolute 06/16/2016 0.2  0 - 0.7 K/uL Final  . Basophils Relative 06/16/2016 1  % Final  . Basophils Absolute 06/16/2016 0.1  0 - 0.1 K/uL Final  . Sodium 06/16/2016 136  135 - 145 mmol/L Final  . Potassium 06/16/2016 3.8  3.5 - 5.1 mmol/L Final  . Chloride 06/16/2016 96* 101 - 111 mmol/L Final  . CO2 06/16/2016 27  22 - 32 mmol/L Final  . Glucose, Bld  06/16/2016 337* 65 - 99 mg/dL Final  . BUN 06/16/2016 9  6 - 20 mg/dL Final  . Creatinine, Ser 06/16/2016 0.65  0.44 - 1.00 mg/dL Final  . Calcium 06/16/2016 8.9  8.9 - 10.3 mg/dL Final  . Total Protein 06/16/2016 8.3* 6.5 - 8.1 g/dL Final  . Albumin 06/16/2016 3.8  3.5 - 5.0 g/dL Final  . AST 06/16/2016 123* 15 - 41 U/L Final  . ALT 06/16/2016 74* 14 - 54 U/L Final  . Alkaline Phosphatase 06/16/2016 83  38 - 126 U/L Final  . Total Bilirubin 06/16/2016 0.6  0.3 - 1.2 mg/dL Final  . GFR calc non Af Amer 06/16/2016 >60  >60 mL/min Final  . GFR calc Af Amer 06/16/2016 >60  >60 mL/min Final   Comment: (NOTE) The eGFR has been calculated using the CKD EPI equation. This calculation has not been validated in all clinical situations. eGFR's persistently <60 mL/min signify possible Chronic Kidney Disease.   . Anion gap 06/16/2016 13  5 - 15 Final    Assessment:  Connie Maldonado is a 60 y.o. female with stage IA left breast cancer s/p wide excision and sentinel lymph node exploration on 09/19/2015.  Pathology revealed a 4 mm  grade I invasive mammary carcinoma.  Margins were negative.  No sentinel lymph node was retrieved.  Tumor was ER > 90%, PR > 90%, and Her2/neu 1+.   Pathologic stage was T1aNxMx.  She completed Mammosite radiation on 10/26/2015.  CA27.29 was 20.2 on 11/08/2015 and 14.3 on 03/14/2016.  Bone density study on 10/17/2015 revealed osteopenia with a T-score of -1.8 in the left femoral neck.  She is on calcium and vitamin D.  She has chronic elevated liver function tests.  Abdominal CT on 12/04/2015 revealed hepatomegaly and mild hepatic steatosis.  There was no acute process or evidence of metastatic disease in the abdomen.  Hepatitis B and C testing was negative on 12/04/2015.  She began Femara on 11/08/2015.  She has mild vasomotor symptoms on Femara.  She is taking black cohosh (Actaea racemosa or Cimicifuga racemosa).  Symptomatically, she is doing well.  Exam is  stable.  Plan: 1.  Labs today:  CBC with diff, CMP, CA27.29. 2.  Continue Femara.  3.  Patient to have dental check prior to next appointment. 4.  Mammogram prior to next appointment (Dr. Bary Castilla orders). 5.  Preauth Prolia. 6.  RTC in 3-4 months for MD assessment, labs (CBC with diff, CMP, CA27.29), review of mammogram, and +/- Prolia.   Lequita Asal, MD  06/16/2016, 9:58 AM

## 2016-06-16 ENCOUNTER — Telehealth: Payer: Self-pay | Admitting: *Deleted

## 2016-06-16 ENCOUNTER — Inpatient Hospital Stay (HOSPITAL_BASED_OUTPATIENT_CLINIC_OR_DEPARTMENT_OTHER): Payer: BLUE CROSS/BLUE SHIELD | Admitting: Hematology and Oncology

## 2016-06-16 ENCOUNTER — Other Ambulatory Visit: Payer: Self-pay | Admitting: *Deleted

## 2016-06-16 ENCOUNTER — Inpatient Hospital Stay: Payer: BLUE CROSS/BLUE SHIELD | Attending: Hematology and Oncology

## 2016-06-16 ENCOUNTER — Ambulatory Visit
Admission: RE | Admit: 2016-06-16 | Discharge: 2016-06-16 | Disposition: A | Discharge status: Home / Self Care | Payer: BLUE CROSS/BLUE SHIELD | Source: Ambulatory Visit | Attending: Radiation Oncology | Admitting: Radiation Oncology

## 2016-06-16 VITALS — BP 145/73 | HR 103 | Temp 98.0°F | Resp 18 | Wt 197.5 lb

## 2016-06-16 DIAGNOSIS — E785 Hyperlipidemia, unspecified: Secondary | ICD-10-CM

## 2016-06-16 DIAGNOSIS — D649 Anemia, unspecified: Secondary | ICD-10-CM

## 2016-06-16 DIAGNOSIS — I10 Essential (primary) hypertension: Secondary | ICD-10-CM

## 2016-06-16 DIAGNOSIS — F419 Anxiety disorder, unspecified: Secondary | ICD-10-CM

## 2016-06-16 DIAGNOSIS — Z170 Estrogen receptor positive status [ER+]: Principal | ICD-10-CM

## 2016-06-16 DIAGNOSIS — R160 Hepatomegaly, not elsewhere classified: Secondary | ICD-10-CM | POA: Diagnosis not present

## 2016-06-16 DIAGNOSIS — G4730 Sleep apnea, unspecified: Secondary | ICD-10-CM | POA: Diagnosis not present

## 2016-06-16 DIAGNOSIS — M85852 Other specified disorders of bone density and structure, left thigh: Secondary | ICD-10-CM

## 2016-06-16 DIAGNOSIS — Z923 Personal history of irradiation: Secondary | ICD-10-CM | POA: Insufficient documentation

## 2016-06-16 DIAGNOSIS — Z79811 Long term (current) use of aromatase inhibitors: Secondary | ICD-10-CM

## 2016-06-16 DIAGNOSIS — R61 Generalized hyperhidrosis: Secondary | ICD-10-CM

## 2016-06-16 DIAGNOSIS — M545 Low back pain: Secondary | ICD-10-CM

## 2016-06-16 DIAGNOSIS — R232 Flushing: Secondary | ICD-10-CM | POA: Diagnosis not present

## 2016-06-16 DIAGNOSIS — K760 Fatty (change of) liver, not elsewhere classified: Secondary | ICD-10-CM

## 2016-06-16 DIAGNOSIS — K219 Gastro-esophageal reflux disease without esophagitis: Secondary | ICD-10-CM | POA: Diagnosis not present

## 2016-06-16 DIAGNOSIS — C50412 Malignant neoplasm of upper-outer quadrant of left female breast: Secondary | ICD-10-CM | POA: Diagnosis not present

## 2016-06-16 DIAGNOSIS — Z79899 Other long term (current) drug therapy: Secondary | ICD-10-CM | POA: Insufficient documentation

## 2016-06-16 DIAGNOSIS — Z7984 Long term (current) use of oral hypoglycemic drugs: Secondary | ICD-10-CM | POA: Insufficient documentation

## 2016-06-16 DIAGNOSIS — M8580 Other specified disorders of bone density and structure, unspecified site: Secondary | ICD-10-CM | POA: Diagnosis not present

## 2016-06-16 DIAGNOSIS — E119 Type 2 diabetes mellitus without complications: Secondary | ICD-10-CM

## 2016-06-16 LAB — COMPREHENSIVE METABOLIC PANEL
ALT: 74 U/L — ABNORMAL HIGH (ref 14–54)
AST: 123 U/L — ABNORMAL HIGH (ref 15–41)
Albumin: 3.8 g/dL (ref 3.5–5.0)
Alkaline Phosphatase: 83 U/L (ref 38–126)
Anion gap: 13 (ref 5–15)
BUN: 9 mg/dL (ref 6–20)
CO2: 27 mmol/L (ref 22–32)
Calcium: 8.9 mg/dL (ref 8.9–10.3)
Chloride: 96 mmol/L — ABNORMAL LOW (ref 101–111)
Creatinine, Ser: 0.65 mg/dL (ref 0.44–1.00)
GFR calc Af Amer: >60 mL/min (ref >60)
GFR calc non Af Amer: >60 mL/min (ref >60)
Glucose, Bld: 337 mg/dL — ABNORMAL HIGH (ref 65–99)
Potassium: 3.8 mmol/L (ref 3.5–5.1)
Sodium: 136 mmol/L (ref 135–145)
Total Bilirubin: 0.6 mg/dL (ref 0.3–1.2)
Total Protein: 8.3 g/dL — ABNORMAL HIGH (ref 6.5–8.1)

## 2016-06-16 LAB — CBC WITH DIFFERENTIAL/PLATELET
Basophils Absolute: 0.1 10*3/uL (ref 0–0.1)
Basophils Relative: 1 %
Eosinophils Absolute: 0.2 10*3/uL (ref 0–0.7)
Eosinophils Relative: 3 %
HCT: 32.8 % — ABNORMAL LOW (ref 35.0–47.0)
Hemoglobin: 10.9 g/dL — ABNORMAL LOW (ref 12.0–16.0)
Lymphocytes Relative: 21 %
Lymphs Abs: 2 10*3/uL (ref 1.0–3.6)
MCH: 26.7 pg (ref 26.0–34.0)
MCHC: 33 g/dL (ref 32.0–36.0)
MCV: 80.7 fL (ref 80.0–100.0)
Monocytes Absolute: 0.7 10*3/uL (ref 0.2–0.9)
Monocytes Relative: 7 %
Neutro Abs: 6.3 10*3/uL (ref 1.4–6.5)
Neutrophils Relative %: 68 %
Platelets: 260 10*3/uL (ref 150–440)
RBC: 4.07 MIL/uL (ref 3.80–5.20)
RDW: 16.5 % — ABNORMAL HIGH (ref 11.5–14.5)
WBC: 9.3 10*3/uL (ref 3.6–11.0)

## 2016-06-16 NOTE — Progress Notes (Signed)
Patient offers no complaints today. 

## 2016-06-16 NOTE — Progress Notes (Signed)
Radiation Oncology Follow up Note  Name: Connie Maldonado   Date:   06/16/2016 MRN:  [id number] DOB: 07/19/1956    This 60 y.o. female presents to the clinic today for six-month follow-up status post accelerated partial breast irradiation to her left breast for stage I invasive mammary carcinoma.  REFERRING PROVIDER: Kirk Ruths, MD  HPI: Patient is a 60 year old female now out 6 months having completed accelerated partial breast irradiation to her left breast for a T1 N0 ER/PR positive HER-2/neu negative invasive mammary carcinoma. She seen today in routine follow-up and is doing well. She specifically denies breast tenderness cough or bone pain.. She is not yet had a follow-up mammogram that is scheduled the next several months. She's currently on Femara tolerating that well without side effect.  COMPLICATIONS OF TREATMENT: none  FOLLOW UP COMPLIANCE: keeps appointments   PHYSICAL EXAM:  There were no vitals taken for this visit. Lungs are clear to A&P cardiac examination essentially unremarkable with regular rate and rhythm. No dominant mass or nodularity is noted in either breast in 2 positions examined. Incision is well-healed. No axillary or supraclavicular adenopathy is appreciated. Cosmetic result is excellent. There is still some thickening in the lumpectomy site which would expect from high dose radiation. This is unchanged over time. Well-developed well-nourished patient in NAD. HEENT reveals PERLA, EOMI, discs not visualized.  Oral cavity is clear. No oral mucosal lesions are identified. Neck is clear without evidence of cervical or supraclavicular adenopathy. Lungs are clear to A&P. Cardiac examination is essentially unremarkable with regular rate and rhythm without murmur rub or thrill. Abdomen is benign with no organomegaly or masses noted. Motor sensory and DTR levels are equal and symmetric in the upper and lower extremities. Cranial nerves II through XII are grossly  intact. Proprioception is intact. No peripheral adenopathy or edema is identified. No motor or sensory levels are noted. Crude visual fields are within normal range.  RADIOLOGY RESULTS: Will review follow-up mammograms when they become available.  PLAN: At the present time patient is doing well with no evidence of disease over 6 months out from accelerated partial breast irradiation. I am please were overall progress. I've asked to see her back in 1 year for follow-up. She knows to call sooner with any concerns. She continues on Femara without side effect.  I would like to take this opportunity to thank you for allowing me to participate in the care of your patient.Armstead Peaks., MD

## 2016-06-16 NOTE — Telephone Encounter (Signed)
-----   Message from Lequita Asal, MD sent at 06/16/2016 11:49 AM EST ----- Regarding: Please call patient with labs and FAX to PCP  Elevated blood sugar.  Chronically elevated LFTs.  M  ----- Message ----- From: Interface, Lab In [place] Sent: 06/16/2016   9:39 AM To: Lequita Asal, MD

## 2016-06-16 NOTE — Patient Instructions (Signed)
Denosumab injection What is this medicine? DENOSUMAB (den oh sue mab) slows bone breakdown. Prolia is used to treat osteoporosis in women after menopause and in men. Xgeva is used to prevent bone fractures and other bone problems caused by cancer bone metastases. Xgeva is also used to treat giant cell tumor of the bone. COMMON BRAND NAME(S): Prolia, XGEVA What should I tell my health care provider before I take this medicine? They need to know if you have any of these conditions: -dental disease -eczema -infection or history of infections -kidney disease or on dialysis -low blood calcium or vitamin D -malabsorption syndrome -scheduled to have surgery or tooth extraction -taking medicine that contains denosumab -thyroid or parathyroid disease -an unusual reaction to denosumab, other medicines, foods, dyes, or preservatives -pregnant or trying to get pregnant -breast-feeding How should I use this medicine? This medicine is for injection under the skin. It is given by a health care professional in a hospital or clinic setting. If you are getting Prolia, a special MedGuide will be given to you by the pharmacist with each prescription and refill. Be sure to read this information carefully each time. For Prolia, talk to your pediatrician regarding the use of this medicine in children. Special care may be needed. For Xgeva, talk to your pediatrician regarding the use of this medicine in children. While this drug may be prescribed for children as young as 13 years for selected conditions, precautions do apply. What if I miss a dose? It is important not to miss your dose. Call your doctor or health care professional if you are unable to keep an appointment. What may interact with this medicine? Do not take this medicine with any of the following medications: -other medicines containing denosumab This medicine may also interact with the following medications: -medicines that suppress the immune  system -medicines that treat cancer -steroid medicines like prednisone or cortisone What should I watch for while using this medicine? Visit your doctor or health care professional for regular checks on your progress. Your doctor or health care professional may order blood tests and other tests to see how you are doing. Call your doctor or health care professional if you get a cold or other infection while receiving this medicine. Do not treat yourself. This medicine may decrease your body's ability to fight infection. You should make sure you get enough calcium and vitamin D while you are taking this medicine, unless your doctor tells you not to. Discuss the foods you eat and the vitamins you take with your health care professional. See your dentist regularly. Brush and floss your teeth as directed. Before you have any dental work done, tell your dentist you are receiving this medicine. Do not become pregnant while taking this medicine or for 5 months after stopping it. Women should inform their doctor if they wish to become pregnant or think they might be pregnant. There is a potential for serious side effects to an unborn child. Talk to your health care professional or pharmacist for more information. What side effects may I notice from receiving this medicine? Side effects that you should report to your doctor or health care professional as soon as possible: -allergic reactions like skin rash, itching or hives, swelling of the face, lips, or tongue -breathing problems -chest pain -fast, irregular heartbeat -feeling faint or lightheaded, falls -fever, chills, or any other sign of infection -muscle spasms, tightening, or twitches -numbness or tingling -skin blisters or bumps, or is dry, peels, or red -slow   healing or unexplained pain in the mouth or jaw -unusual bleeding or bruising Side effects that usually do not require medical attention (report to your doctor or health care professional  if they continue or are bothersome): -muscle pain -stomach upset, gas Where should I keep my medicine? This medicine is only given in a clinic, doctor's office, or other health care setting and will not be stored at home.  2017 Elsevier/Gold Standard (2015-08-09 10:06:55)  

## 2016-06-16 NOTE — Telephone Encounter (Signed)
Called patient and LVM that LFT and glucose are elevated.  Also sent copies to PCP.

## 2016-06-17 LAB — CANCER ANTIGEN 27.29: CA 27.29: 22.6 U/mL (ref 0.0–38.6)

## 2016-06-27 ENCOUNTER — Ambulatory Visit: Payer: BLUE CROSS/BLUE SHIELD | Admitting: Radiation Oncology

## 2016-07-01 ENCOUNTER — Ambulatory Visit: Payer: BLUE CROSS/BLUE SHIELD | Admitting: Radiation Oncology

## 2016-08-03 ENCOUNTER — Encounter: Payer: Self-pay | Admitting: Hematology and Oncology

## 2016-09-04 ENCOUNTER — Other Ambulatory Visit: Payer: Self-pay

## 2016-09-04 DIAGNOSIS — C50412 Malignant neoplasm of upper-outer quadrant of left female breast: Secondary | ICD-10-CM

## 2016-11-06 ENCOUNTER — Ambulatory Visit
Admission: RE | Admit: 2016-11-06 | Discharge: 2016-11-06 | Disposition: A | Discharge status: Home / Self Care | Payer: BLUE CROSS/BLUE SHIELD | Source: Ambulatory Visit | Attending: General Surgery | Admitting: General Surgery

## 2016-11-06 ENCOUNTER — Ambulatory Visit (INDEPENDENT_AMBULATORY_CARE_PROVIDER_SITE_OTHER): Payer: BLUE CROSS/BLUE SHIELD | Admitting: General Surgery

## 2016-11-06 ENCOUNTER — Encounter: Payer: Self-pay | Admitting: General Surgery

## 2016-11-06 VITALS — BP 152/82 | HR 102 | Resp 14 | Ht 67.0 in | Wt 198.0 lb

## 2016-11-06 DIAGNOSIS — C50412 Malignant neoplasm of upper-outer quadrant of left female breast: Secondary | ICD-10-CM

## 2016-11-06 DIAGNOSIS — Z170 Estrogen receptor positive status [ER+]: Secondary | ICD-10-CM | POA: Diagnosis not present

## 2016-11-06 NOTE — Progress Notes (Signed)
Patient ID: Connie Maldonado, female   DOB: 12-26-1955, 61 y.o.   MRN: [id number]  Chief Complaint  Patient presents with  . Follow-up    HPI [name] is a 61 y.o. female.  who presents for her follow up breast cancer and a breast evaluation. The most recent mammogram was done on 11-06-16.  Patient does perform regular self breast checks and gets regular mammograms done.  No new breast issues. Tolerating Femara. She is living in [place] now, and enjoying it.  HPI  Past Medical History:  Diagnosis Date  . Anemia   . Anxiety   . Breast cancer of upper-outer quadrant of left female breast ([place]) 09/19/2015   4 mm, T1a, N0l ER positive, PR positive, HER-2/neu not amplified. Wide excision, MammoSite.  . Cancer ([place])   . Diabetes mellitus without complication ([place])   . GERD (gastroesophageal reflux disease)   . Hyperlipidemia   . Hypertension   . Lumbar pain   . Sleep apnea    + SLEEP STUDY 2 YEARS AGO BUT PT STATES NO ONE EVER HELPED HER GET A CPAP MACHINE     Past Surgical History:  Procedure Laterality Date  . AXILLARY LYMPH NODE BIOPSY Left 09/19/2015   Procedure: AXILLARY LYMPH NODE BIOPSY;  Surgeon: Robert Bellow, MD;  Location: [facility] ORS;  Service: General;  Laterality: Left;  . BREAST BIOPSY Left   . BREAST LUMPECTOMY WITH SENTINEL LYMPH NODE BIOPSY Left 09/19/2015   Procedure: BREAST LUMPECTOMY WITH SENTINEL LYMPH NODE BX;  Surgeon: Robert Bellow, MD;  Location: [facility] ORS;  Service: General;  Laterality: Left;  . COLONOSCOPY  05/04/2006 ([place]), 2015 [place]   6 mm hyperplastic polyp, internal hemorrhoids/ Dr Rayann Heman Jan 2015  . DILATION AND CURETTAGE OF UTERUS     X 2  . ESOPHAGOGASTRODUODENOSCOPY  06/12/2008 ([place])   reflux esophagitis, gastritis  . TONSILLECTOMY     AGE 74  . TUBAL LIGATION      Family History  Problem Relation Age of Onset  . Heart disease Mother   . Hypertension Mother   . Cancer Father     stomach  . Stomach cancer Father 77  . Colon cancer  Father   . Breast cancer Neg Hx     Social History Social History  Substance Use Topics  . Smoking status: Former Smoker    Years: 25.00    Types: Cigarettes  . Smokeless tobacco: Never Used     Comment: PT SMOKES 1 CIG MAYBE EVERY WEEK WITH THE HELP OF CHANTIX BUT PRIOR TO THAT PT WAS SMOKING 1PPD  . Alcohol use 1.0 oz/week    2 Standard drinks or equivalent per week     Comment: OCC    No Known Allergies  Current Outpatient Prescriptions  Medication Sig Dispense Refill  . amLODipine (NORVASC) 10 MG tablet Take 1 tablet (10 mg total) by mouth every morning. 90 tablet 3  . atorvastatin (LIPITOR) 40 MG tablet Take 40 mg by mouth every morning.    . Calcium Carb-Cholecalciferol (CALCIUM 600 + D PO) Take 1 tablet by mouth daily.    . CHANTIX 1 MG tablet TAKE ONE TABLET TWICE DAILY 56 tablet 3  . esomeprazole (NEXIUM) 40 MG capsule TAKE 1 CAPSULE EVERY DAY 90 capsule 2  . fluconazole (DIFLUCAN) 150 MG tablet Take 1 tablet (150 mg total) by mouth once. 2 tablet 1  . HYDROcodone-acetaminophen (VICODIN) 5-500 MG per tablet Take 1 tablet by mouth every 6 (six) hours as needed.  Reported on 09/26/2015    . letrozole (FEMARA) 2.5 MG tablet TAKE ONE TABLET BY MOUTH EVERY DAY 90 tablet 1  . metaxalone (SKELAXIN) 800 MG tablet daily as needed. Take one by mouth at bedtime    . metFORMIN (GLUCOPHAGE) 500 MG tablet TAKE ONE TABLET TWICE DAILY WITH MEALS (Patient taking differently: TAKE two TABLET TWICE DAILY WITH MEALS) 180 tablet 0  . Multiple Vitamin (MULTIVITAMIN) tablet Take 1 tablet by mouth daily.    . valsartan (DIOVAN) 160 MG tablet Take 1 tablet (160 mg total) by mouth every morning. 90 tablet 3  . vitamin C (ASCORBIC ACID) 500 MG tablet Take 500 mg by mouth daily. Reported on 11/08/2015    . Zinc 50 MG TABS Take 1 tablet by mouth daily. Reported on 11/08/2015    . zolpidem (AMBIEN CR) 6.25 MG CR tablet TAKE 1 TO 2 TABLETS AT BEDTIME AS NEEDED 60 tablet 0   No current facility-administered  medications for this visit.     Review of Systems Review of Systems  Constitutional: Negative.   Respiratory: Negative.   Cardiovascular: Negative.     Blood pressure (!) 152/82, pulse (!) 102, resp. rate 14, height _0  (1.702 m), weight 198 lb (89.8 kg).  Physical Exam Physical Exam  Constitutional: She is oriented to person, place, and time. She appears well-developed and well-nourished.  HENT:  Mouth/Throat: Oropharynx is clear and moist.  Eyes: Conjunctivae are normal. No scleral icterus.  Neck: Neck supple.  Cardiovascular: Normal rate, regular rhythm and normal heart sounds.   Pulmonary/Chest: Effort normal and breath sounds normal. Right breast exhibits no inverted nipple, no mass, no nipple discharge, no skin change and no tenderness. Left breast exhibits no inverted nipple, no mass, no nipple discharge, no skin change and no tenderness.    Lymphadenopathy:    She has no cervical adenopathy.    She has no axillary adenopathy.  Neurological: She is alert and oriented to person, place, and time.  Skin: Skin is warm and dry.  Psychiatric: Her behavior is normal.    Data Reviewed Bilateral diagnostic mammograms completed earlier today were reviewed. Mild postsurgical scarring noted in the left breast. BI-RADS-2. Assessment    Tolerating Femara without difficulty.  Normal clinical and mammographic exam of the breast.    Plan         The patient has been asked to return to the office in one year with a bilateral diagnostic mammogram. The patient is aware to call back for any questions or new concerns.   HPI, Physical Exam, Assessment and Plan have been scribed under the direction and in the presence of Robert Bellow, MD.  Karie Fetch, RN  I have completed the exam and reviewed the above documentation for accuracy and completeness.  I agree with the above.  Haematologist has been used and any errors in dictation or transcription are  unintentional.  Hervey Ard, M.D., F.A.C.S.   Robert Bellow 11/07/2016, 3:13 PM

## 2016-11-06 NOTE — Patient Instructions (Signed)
The patient has been asked to return to the office in one year with a bilateral diagnostic mammogram. 

## 2016-11-10 ENCOUNTER — Other Ambulatory Visit: Payer: BLUE CROSS/BLUE SHIELD

## 2016-11-10 ENCOUNTER — Ambulatory Visit: Payer: BLUE CROSS/BLUE SHIELD

## 2016-11-10 ENCOUNTER — Ambulatory Visit: Payer: BLUE CROSS/BLUE SHIELD | Admitting: Hematology and Oncology

## 2016-11-17 ENCOUNTER — Inpatient Hospital Stay: Payer: BLUE CROSS/BLUE SHIELD | Admitting: Hematology and Oncology

## 2016-11-17 ENCOUNTER — Inpatient Hospital Stay: Payer: BLUE CROSS/BLUE SHIELD

## 2016-11-20 ENCOUNTER — Telehealth: Payer: Self-pay | Admitting: *Deleted

## 2016-11-20 NOTE — Telephone Encounter (Signed)
Message left for patient to call back with dentist information so that we can get dental clearance for her bisphosphanate therapy. Will await patients call.

## 2016-12-01 ENCOUNTER — Other Ambulatory Visit: Payer: BLUE CROSS/BLUE SHIELD

## 2016-12-01 ENCOUNTER — Ambulatory Visit: Payer: BLUE CROSS/BLUE SHIELD | Admitting: Hematology and Oncology

## 2016-12-01 ENCOUNTER — Ambulatory Visit: Payer: BLUE CROSS/BLUE SHIELD

## 2017-06-26 ENCOUNTER — Ambulatory Visit: Payer: BLUE CROSS/BLUE SHIELD | Admitting: Radiation Oncology

## 2017-09-21 ENCOUNTER — Other Ambulatory Visit: Payer: Self-pay

## 2017-09-21 DIAGNOSIS — C50412 Malignant neoplasm of upper-outer quadrant of left female breast: Secondary | ICD-10-CM

## 2017-09-21 DIAGNOSIS — Z170 Estrogen receptor positive status [ER+]: Principal | ICD-10-CM

## 2017-11-10 ENCOUNTER — Ambulatory Visit: Payer: BLUE CROSS/BLUE SHIELD | Admitting: General Surgery

## 2017-11-10 ENCOUNTER — Other Ambulatory Visit: Payer: BLUE CROSS/BLUE SHIELD

## 2017-11-17 ENCOUNTER — Ambulatory Visit
Admission: RE | Admit: 2017-11-17 | Discharge: 2017-11-17 | Disposition: A | Discharge status: Home / Self Care | Payer: BLUE CROSS/BLUE SHIELD | Source: Ambulatory Visit | Attending: General Surgery | Admitting: General Surgery

## 2017-11-17 ENCOUNTER — Encounter: Payer: Self-pay | Admitting: General Surgery

## 2017-11-17 ENCOUNTER — Ambulatory Visit: Payer: BLUE CROSS/BLUE SHIELD | Admitting: General Surgery

## 2017-11-17 VITALS — BP 130/72 | HR 86 | Resp 14 | Ht 67.0 in | Wt 198.0 lb

## 2017-11-17 DIAGNOSIS — Z170 Estrogen receptor positive status [ER+]: Secondary | ICD-10-CM | POA: Insufficient documentation

## 2017-11-17 DIAGNOSIS — C50412 Malignant neoplasm of upper-outer quadrant of left female breast: Secondary | ICD-10-CM

## 2017-11-17 HISTORY — DX: Personal history of irradiation: Z92.3

## 2017-11-17 NOTE — Progress Notes (Addendum)
Patient ID: Connie Maldonado, female   DOB: 1956/03/10, 62 y.o.   MRN: [id number]  Chief Complaint  Patient presents with  . Follow-up    HPI Connie Maldonado is a 62 y.o. female who presents for a breast evaluation. The most recent mammogram was done on 11/10/2017.   Patient does perform regular self breast checks and gets regular mammograms done.    HPI  Past Medical History:  Diagnosis Date  . Anemia   . Anxiety   . Breast cancer of upper-outer quadrant of left female breast ([place]) 09/19/2015   4 mm, T1a, N0l ER positive, PR positive, HER-2/neu not amplified. Wide excision, MammoSite.  . Cancer ([place])   . Diabetes mellitus without complication ([place])   . GERD (gastroesophageal reflux disease)   . Hyperlipidemia   . Hypertension   . Lumbar pain   . Personal history of radiation therapy   . Sleep apnea    Patient unable to tolerate CPAP.    Past Surgical History:  Procedure Laterality Date  . AXILLARY LYMPH NODE BIOPSY Left 09/19/2015   Procedure: AXILLARY LYMPH NODE BIOPSY;  Surgeon: Robert Bellow, MD;  Location: [facility] ORS;  Service: General;  Laterality: Left;  . BREAST BIOPSY Left   . BREAST LUMPECTOMY WITH SENTINEL LYMPH NODE BIOPSY Left 09/19/2015   Procedure: BREAST LUMPECTOMY WITH SENTINEL LYMPH NODE BX;  Surgeon: Robert Bellow, MD;  Location: [facility] ORS;  Service: General;  Laterality: Left;  . COLONOSCOPY  05/04/2006 ([place]), 2015 [place]   6 mm hyperplastic polyp, internal hemorrhoids/ Dr Rayann Heman Jan 2015  . DILATION AND CURETTAGE OF UTERUS     X 2  . ESOPHAGOGASTRODUODENOSCOPY  06/12/2008 ([place])   reflux esophagitis, gastritis  . TONSILLECTOMY     AGE 17  . TUBAL LIGATION      Family History  Problem Relation Age of Onset  . Heart disease Mother   . Hypertension Mother   . Cancer Father        stomach  . Stomach cancer Father 47  . Colon cancer Father   . Breast cancer Neg Hx     Social History Social History   Tobacco Use  . Smoking status: Former Smoker     Years: 25.00    Types: Cigarettes  . Smokeless tobacco: Never Used  . Tobacco comment: PT SMOKES 1 CIG MAYBE EVERY WEEK WITH THE HELP OF CHANTIX BUT PRIOR TO THAT PT WAS SMOKING 1PPD  Substance Use Topics  . Alcohol use: Yes    Alcohol/week: 1.0 oz    Types: 2 Standard drinks or equivalent per week    Comment: OCC  . Drug use: No    No Known Allergies  Current Outpatient Medications  Medication Sig Dispense Refill  . amLODipine (NORVASC) 10 MG tablet Take 1 tablet (10 mg total) by mouth every morning. 90 tablet 3  . atorvastatin (LIPITOR) 40 MG tablet Take 40 mg by mouth every morning.    . Calcium Carb-Cholecalciferol (CALCIUM 600 + D PO) Take 1 tablet by mouth daily.    . CHANTIX 1 MG tablet TAKE ONE TABLET TWICE DAILY 56 tablet 3  . esomeprazole (NEXIUM) 40 MG capsule TAKE 1 CAPSULE EVERY DAY 90 capsule 2  . fluconazole (DIFLUCAN) 150 MG tablet Take 1 tablet (150 mg total) by mouth once. 2 tablet 1  . HYDROcodone-acetaminophen (VICODIN) 5-500 MG per tablet Take 1 tablet by mouth every 6 (six) hours as needed. Reported on 09/26/2015    . letrozole [facility]) 2.5  MG tablet TAKE ONE TABLET BY MOUTH EVERY DAY 90 tablet 1  . metaxalone (SKELAXIN) 800 MG tablet daily as needed. Take one by mouth at bedtime    . metFORMIN (GLUCOPHAGE) 500 MG tablet TAKE ONE TABLET TWICE DAILY WITH MEALS (Patient taking differently: TAKE two TABLET TWICE DAILY WITH MEALS) 180 tablet 0  . Multiple Vitamin (MULTIVITAMIN) tablet Take 1 tablet by mouth daily.    . valsartan (DIOVAN) 160 MG tablet Take 1 tablet (160 mg total) by mouth every morning. 90 tablet 3  . vitamin C (ASCORBIC ACID) 500 MG tablet Take 500 mg by mouth daily. Reported on 11/08/2015    . Zinc 50 MG TABS Take 1 tablet by mouth daily. Reported on 11/08/2015    . zolpidem (AMBIEN CR) 6.25 MG CR tablet TAKE 1 TO 2 TABLETS AT BEDTIME AS NEEDED 60 tablet 0   No current facility-administered medications for this visit.     Review of Systems Review  of Systems  Constitutional: Negative.   Respiratory: Negative.   Cardiovascular: Negative.     Blood pressure 130/72, pulse 86, resp. rate 14, height '5\' 7"'$  (1.702 m), weight 198 lb (89.8 kg).  Physical Exam Physical Exam  Constitutional: She is oriented to person, place, and time. She appears well-developed and well-nourished.  Eyes: Conjunctivae are normal. No scleral icterus.  Neck: Neck supple.  Cardiovascular: Normal rate, regular rhythm and normal heart sounds.  Pulmonary/Chest: Effort normal and breath sounds normal. Right breast exhibits no inverted nipple, no mass, no nipple discharge, no skin change and no tenderness. Left breast exhibits no inverted nipple, no mass, no nipple discharge, no skin change and no tenderness.    Lymphadenopathy:    She has no cervical adenopathy.    She has no axillary adenopathy.  Neurological: She is alert and oriented to person, place, and time.  Skin: Skin is warm and dry.    Data Reviewed Bilateral diagnostic mammograms of November 17, 2017 were reviewed.  Postsurgical changes.  BI-RADS-2.  Bone density of October 17, 2015 showed a T score of -1.8.  Assessment    Unremarkable breast exam.  Unable to tolerate CPAP.  Unwilling to go for a second fitting.  Candidate for repeat bone density.    Plan  A prescription for the patient have a bone density completed in her home county was provided.  She was asked to have a copy of the report forwarded to this office.  Patient will be asked to return to the office in one year with a bilateral diagnotic mammogram, same day mammogram and office visit.  The patient is aware to call back for any questions or concerns.Needs bone density test done this year.    HPI, Physical Exam, Assessment and Plan have been scribed under the direction and in the presence of Hervey Ard, MD.  Gaspar Cola, CMA  I have completed the exam and reviewed the above documentation for accuracy and completeness.  I  agree with the above.  Haematologist has been used and any errors in dictation or transcription are unintentional.  Hervey Ard, M.D., F.A.C.S.  Forest Gleason Waylyn Tenbrink 11/18/2017, 5:32 PM

## 2017-11-17 NOTE — Patient Instructions (Addendum)
Patient will be asked to return to the office in one year with a bilateral diagnotic mammogram. The patient is aware to call back for any questions or concerns. Needs bone density test done this year.

## 2017-11-18 ENCOUNTER — Encounter: Payer: Self-pay | Admitting: General Surgery

## 2017-11-26 IMAGING — MG MM DIGITAL DIAGNOSTIC BILAT W/ TOMO W/ CAD
8 of 16 series · 8 of 36 positions shown · non-contrast
Comparison: Previous exam(s).

CLINICAL DATA: Patient underwent a lumpectomy of the left breast in
September 2015.This is her first postoperative mammogram.

EXAM:
2D DIGITAL DIAGNOSTIC BILATERAL MAMMOGRAM WITH CAD AND ADJUNCT TOMO

[L XCCL (1 of 2)]
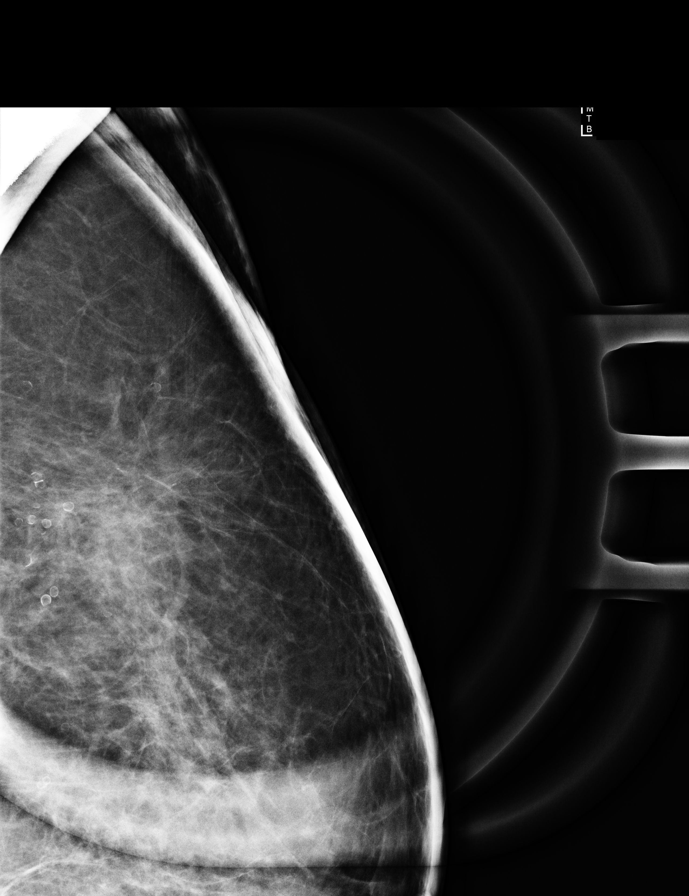

[L CC]
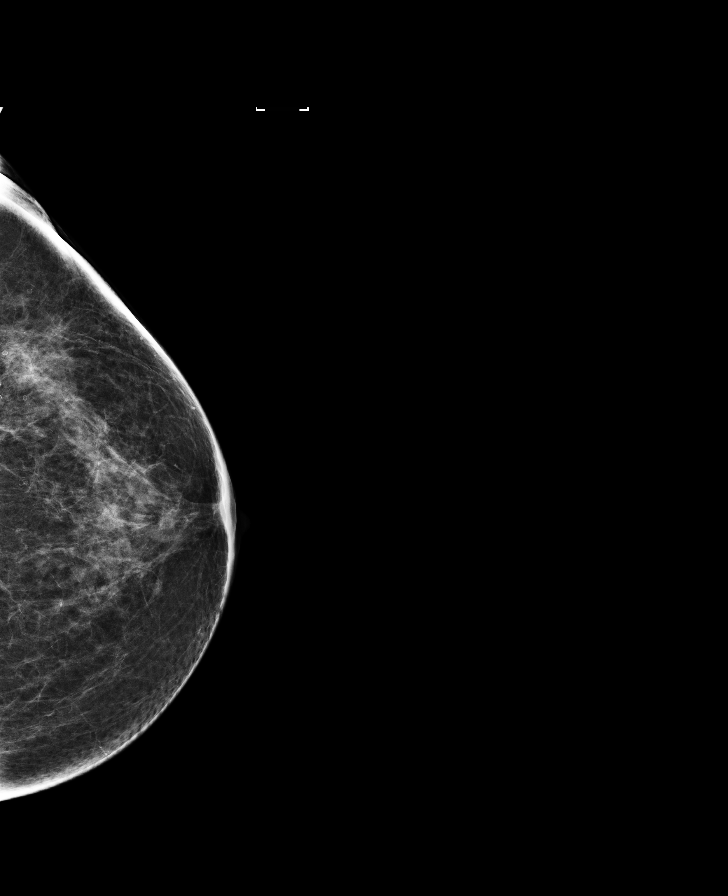

[R CC]
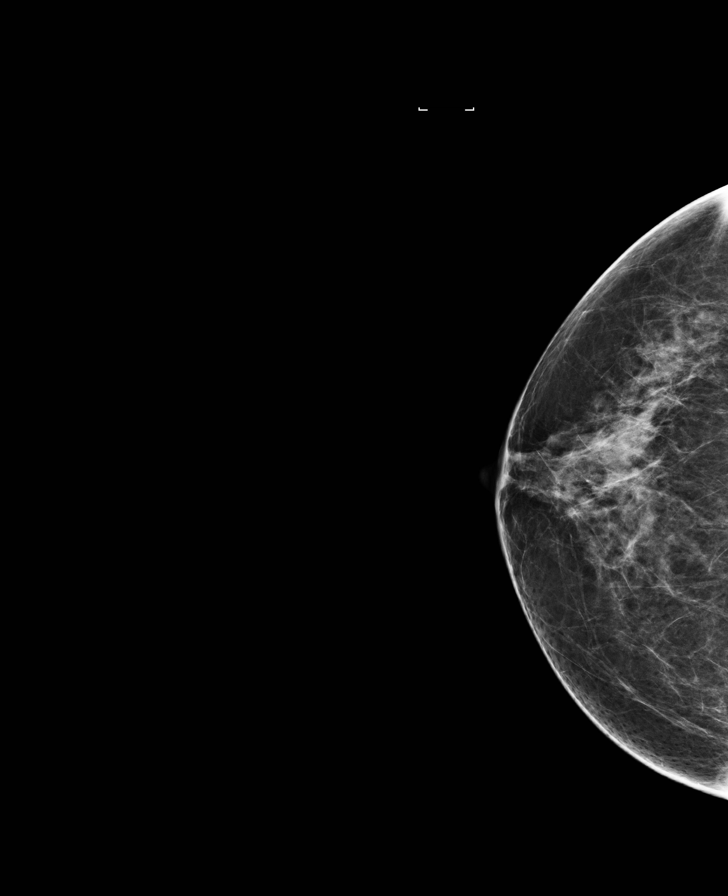

[L MLO]
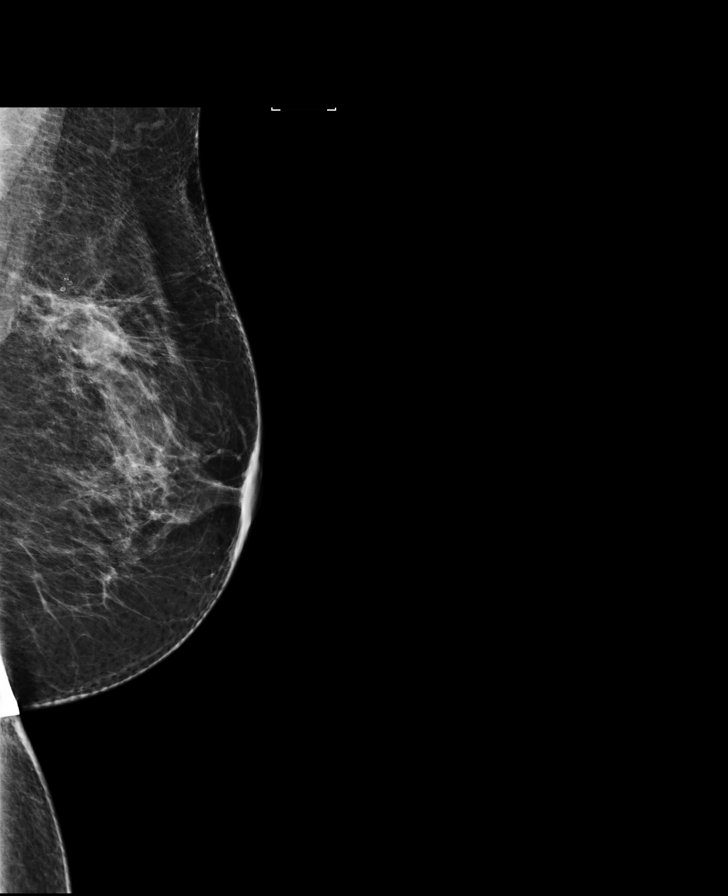

[L XCCL (2 of 2)]
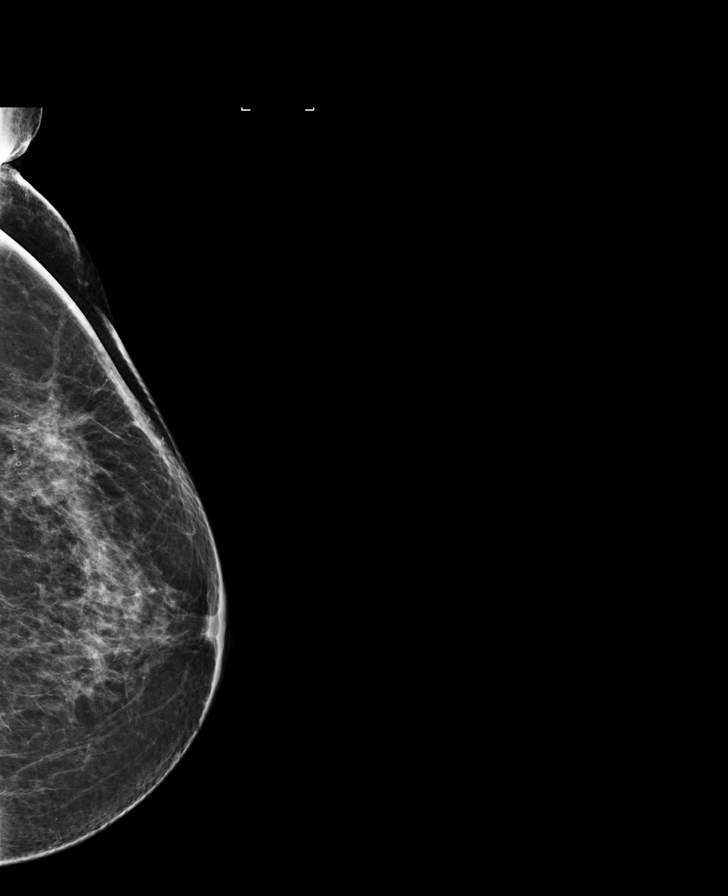

[L MLO synth-2D]
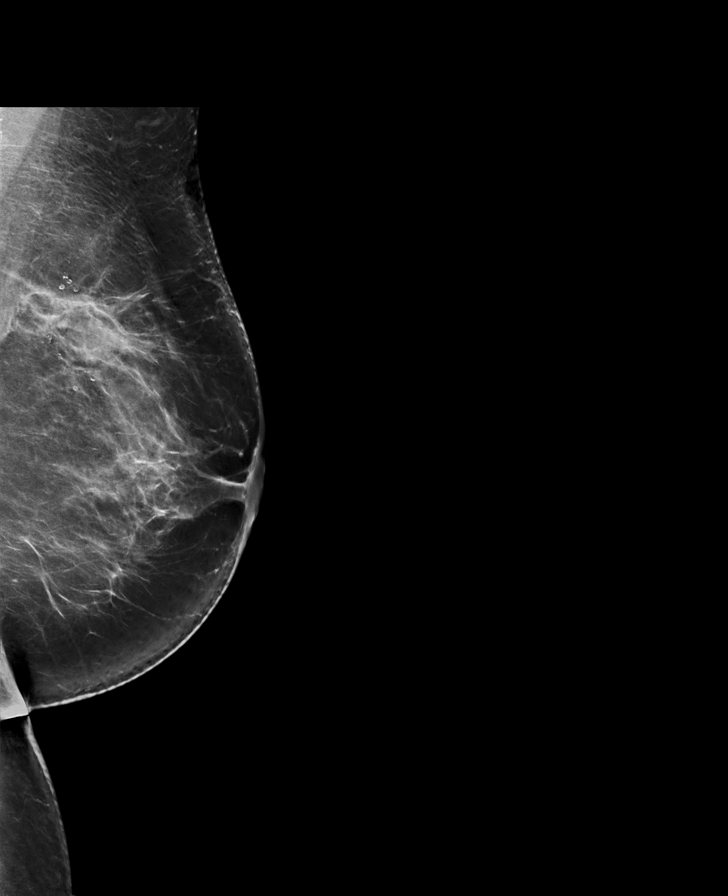

[R MLO synth-2D]
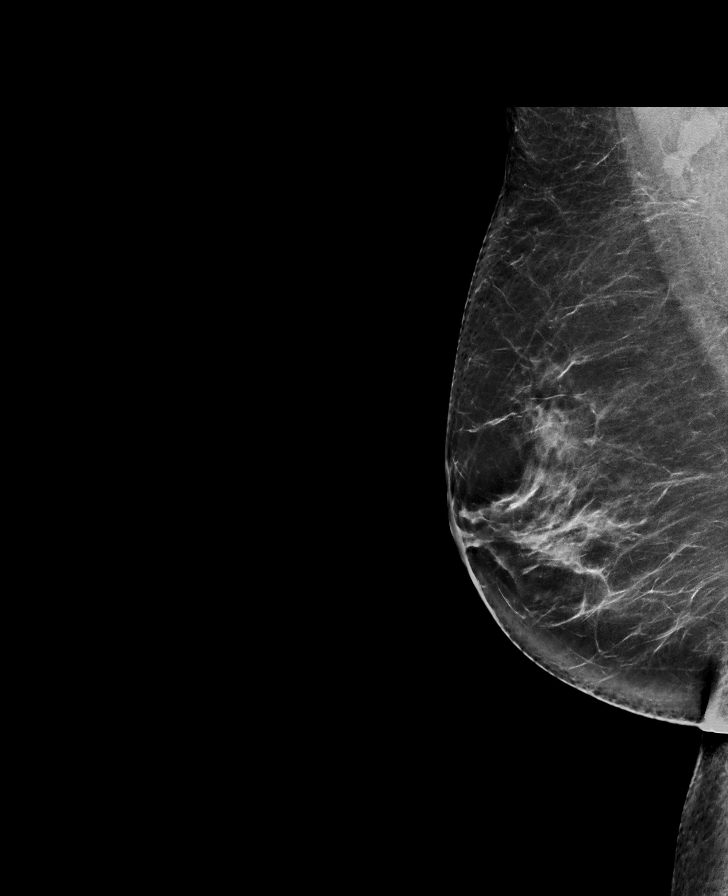

[L CC synth-2D]
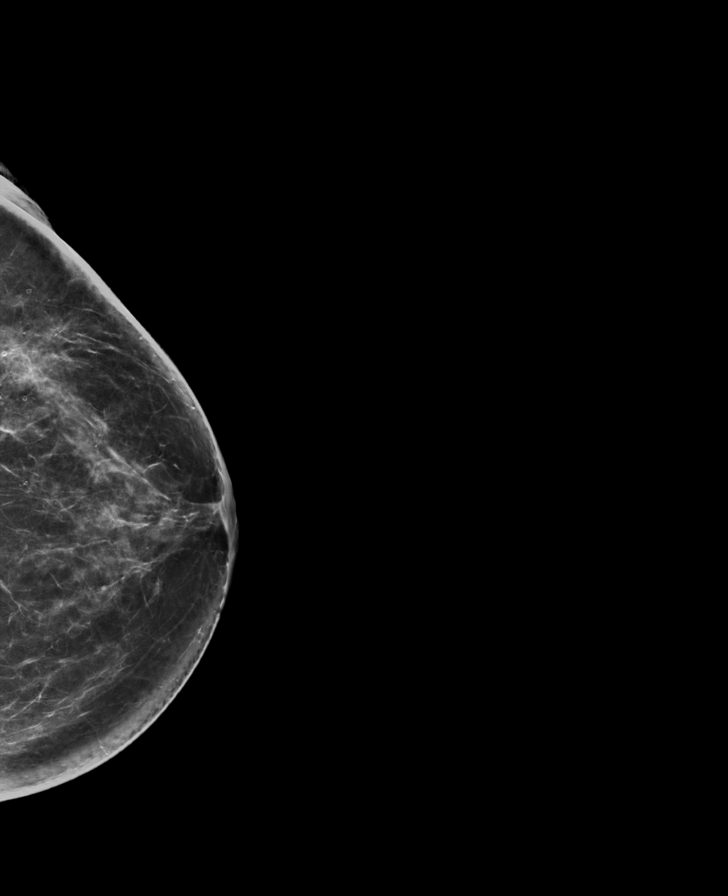

[8 of 36 positions shown; findings below may reference images not displayed]

ACR Breast Density Category c: The breast tissue is heterogeneously
dense, which may obscure small masses.
FINDINGS: There is focal mixed density with associated architectural
distortion in the posterior upper outer left breast consistent with
the lumpectomy site.

There are no discrete masses or other areas of architectural
distortion. There are no suspicious calcifications.

Mammographic images were processed with CAD.
IMPRESSION: 1. No evidence of recurrent or new breast malignancy.
2. Benign postsurgical changes on the left.

RECOMMENDATION:
Diagnostic mammography in 1 year per standard post lumpectomy
protocol.

I have discussed the findings and recommendations with the patient.
Results were also provided in writing at the conclusion of the
visit. If applicable, a reminder letter will be sent to the patient
regarding the next appointment.

BI-RADS CATEGORY  2: Benign.

## 2018-02-19 ENCOUNTER — Encounter: Payer: Self-pay | Admitting: General Surgery

## 2018-09-22 ENCOUNTER — Other Ambulatory Visit: Payer: Self-pay

## 2018-09-22 DIAGNOSIS — Z170 Estrogen receptor positive status [ER+]: Principal | ICD-10-CM

## 2018-09-22 DIAGNOSIS — C50412 Malignant neoplasm of upper-outer quadrant of left female breast: Secondary | ICD-10-CM

## 2018-11-19 ENCOUNTER — Other Ambulatory Visit: Payer: BLUE CROSS/BLUE SHIELD

## 2018-11-30 ENCOUNTER — Ambulatory Visit: Payer: BLUE CROSS/BLUE SHIELD | Admitting: General Surgery

## 2018-11-30 ENCOUNTER — Other Ambulatory Visit: Payer: BLUE CROSS/BLUE SHIELD

## 2019-01-04 ENCOUNTER — Ambulatory Visit
Admission: RE | Admit: 2019-01-04 | Discharge: 2019-01-04 | Disposition: A | Discharge status: Home / Self Care | Payer: BLUE CROSS/BLUE SHIELD | Source: Ambulatory Visit | Attending: General Surgery | Admitting: General Surgery

## 2019-01-04 ENCOUNTER — Encounter: Payer: Self-pay | Admitting: General Surgery

## 2019-01-04 ENCOUNTER — Ambulatory Visit: Payer: BLUE CROSS/BLUE SHIELD | Admitting: General Surgery

## 2019-01-04 ENCOUNTER — Other Ambulatory Visit: Payer: Self-pay

## 2019-01-04 VITALS — BP 134/78 | HR 88 | Temp 98.1°F | Resp 16 | Ht 67.0 in | Wt 190.0 lb

## 2019-01-04 DIAGNOSIS — Z170 Estrogen receptor positive status [ER+]: Secondary | ICD-10-CM

## 2019-01-04 DIAGNOSIS — C50412 Malignant neoplasm of upper-outer quadrant of left female breast: Secondary | ICD-10-CM | POA: Insufficient documentation

## 2019-01-04 NOTE — Patient Instructions (Addendum)
The patient is aware to call back for any questions or new concerns.  Recommend Dermatologist evaluation for area left chest wall

## 2019-01-04 NOTE — Progress Notes (Signed)
Patient ID: Connie Maldonado, female   DOB: [birth year], 63 y.o.   MRN: [id number]  Chief Complaint  Patient presents with  . Follow-up    mammogram    HPI Connie Maldonado is a 63 y.o. female who presents for her follow up left breast cancer and a breast evaluation. The most recent mammogram was done on  01/04/2019.  Patient does perform regular self breast checks and gets regular mammograms done.   She is here with Yetta Flock, daughter in law. She fractured her ankle 07-23-18 and has had 5 surgeries since.  HPI  Past Medical History:  Diagnosis Date  . Anemia   . Anxiety   . Breast cancer of upper-outer quadrant of left female breast ([place]) 09/19/2015   4 mm, T1a, N0l ER positive, PR positive, HER-2/neu not amplified. Wide excision, [facility].  . Cancer ([place])   . Diabetes mellitus without complication ([place])   . GERD (gastroesophageal reflux disease)   . Hyperlipidemia   . Hypertension   . Lumbar pain   . Personal history of radiation therapy   . Sleep apnea    Patient unable to tolerate CPAP.    Past Surgical History:  Procedure Laterality Date  . AXILLARY LYMPH NODE BIOPSY Left 09/19/2015   Procedure: AXILLARY LYMPH NODE BIOPSY;  Surgeon: Robert Bellow, MD;  Location: [facility] ORS;  Service: General;  Laterality: Left;  . BREAST BIOPSY Left 06/2015   positive  . BREAST LUMPECTOMY Left 2017  . BREAST LUMPECTOMY WITH SENTINEL LYMPH NODE BIOPSY Left 09/19/2015   Procedure: BREAST LUMPECTOMY WITH SENTINEL LYMPH NODE BX;  Surgeon: Robert Bellow, MD;  Location: [facility] ORS;  Service: General;  Laterality: Left;  . COLONOSCOPY  05/04/2006 ([place]), 2015 [facility]   6 mm hyperplastic polyp, internal hemorrhoids/ Dr Rayann Heman Jan 2015  . DILATION AND CURETTAGE OF UTERUS     X 2  . ESOPHAGOGASTRODUODENOSCOPY  06/12/2008 ([place])   reflux esophagitis, gastritis  . TONSILLECTOMY     AGE 70  . TUBAL LIGATION      Family History  Problem Relation Age of Onset  . Heart disease Mother   . Hypertension Mother    . Cancer Father        stomach  . Stomach cancer Father 28  . Colon cancer Father   . Breast cancer Neg Hx     Social History Social History   Tobacco Use  . Smoking status: Former Smoker    Years: 25.00    Types: Cigarettes  . Smokeless tobacco: Never Used  . Tobacco comment: PT SMOKES 1 CIG MAYBE EVERY WEEK WITH THE HELP OF CHANTIX BUT PRIOR TO THAT PT WAS SMOKING 1PPD  Substance Use Topics  . Alcohol use: Yes    Alcohol/week: 2.0 standard drinks    Types: 2 Standard drinks or equivalent per week    Comment: OCC  . Drug use: No    Allergies  Allergen Reactions  . Venlafaxine Other (See Comments)    Sinus infection symptoms and panic attacks     Current Outpatient Medications  Medication Sig Dispense Refill  . amLODipine (NORVASC) 10 MG tablet Take 1 tablet (10 mg total) by mouth every morning. (Patient taking differently: Take 5 mg by mouth every morning. ) 90 tablet 3  . atorvastatin (LIPITOR) 40 MG tablet Take 20 mg by mouth every morning.     . Calcium Carb-Cholecalciferol (CALCIUM 600 + D PO) Take 1 tablet by mouth daily.    . CHANTIX 1 MG tablet  TAKE ONE TABLET TWICE DAILY 56 tablet 3  . dicloxacillin (DYNAPEN) 500 MG capsule Take 500 mg by mouth 2 (two) times daily.    . empagliflozin (JARDIANCE) 10 MG TABS tablet Take 10 mg by mouth daily.    Marland Kitchen esomeprazole (NEXIUM) 40 MG capsule TAKE 1 CAPSULE EVERY DAY 90 capsule 2  . gabapentin (NEURONTIN) 400 MG capsule Take 400 mg by mouth 2 (two) times daily.    Marland Kitchen glipiZIDE (GLUCOTROL) 10 MG tablet Take 10 mg by mouth daily before breakfast.    . HYDROcodone-acetaminophen (VICODIN) 5-500 MG per tablet Take 1 tablet by mouth every 6 (six) hours as needed. Reported on 09/26/2015    . Isavuconazonium Sulfate (CRESEMBA) 186 MG CAPS Take 186 mg by mouth daily. 2 tabs once daily    . letrozole (FEMARA) 2.5 MG tablet TAKE ONE TABLET BY MOUTH EVERY DAY 90 tablet 1  . losartan (COZAAR) 100 MG tablet Take 100 mg by mouth daily.     . metaxalone (SKELAXIN) 800 MG tablet daily as needed. Take one by mouth at bedtime    . methocarbamol (ROBAXIN) 500 MG tablet Take 500 mg by mouth 2 (two) times daily.    . sitaGLIPtin (JANUVIA) 100 MG tablet Take 100 mg by mouth daily.    Marland Kitchen zolpidem (AMBIEN CR) 6.25 MG CR tablet TAKE 1 TO 2 TABLETS AT BEDTIME AS NEEDED 60 tablet 0   No current facility-administered medications for this visit.     Review of Systems Review of Systems  Constitutional: Negative.   Respiratory: Negative.   Cardiovascular: Negative.     Blood pressure 134/78, pulse 88, temperature 98.1 F (36.7 C), temperature source Temporal, resp. rate 16, height _0  (1.702 m), weight 190 lb (86.2 kg), SpO2 95 %.  Physical Exam Physical Exam Exam conducted with a chaperone present.  Constitutional:      Appearance: She is well-developed.  Eyes:     General: No scleral icterus.    Conjunctiva/sclera: Conjunctivae normal.  Neck:     Musculoskeletal: Neck supple.  Cardiovascular:     Rate and Rhythm: Normal rate and regular rhythm.     Heart sounds: Normal heart sounds.  Pulmonary:     Effort: Pulmonary effort is normal.     Breath sounds: Normal breath sounds.  Chest:     Breasts:        Right: Normal. No inverted nipple, mass, nipple discharge, skin change or tenderness.        Left: No inverted nipple, mass, nipple discharge, skin change or tenderness.       Comments: Left lumpectomy Lymphadenopathy:     Cervical: No cervical adenopathy.     Upper Body:     Right upper body: No supraclavicular or axillary adenopathy.     Left upper body: No supraclavicular or axillary adenopathy.  Skin:    General: Skin is warm and dry.  Neurological:     Mental Status: She is alert and oriented to person, place, and time.  Psychiatric:        Behavior: Behavior normal.     Data Reviewed Bilateral diagnostic mammograms dated today were independently reviewed.  Postsurgical changes with increasing  calcifications in the radiation field.  BI-RADS-2.  Assessment Doing well now 3 years post breast conservation for a T1a lesion.  Good tolerance of antiestrogen therapy.  Plan The patient was offered the opportunity to have her annual exams completed at the [facility] where she is now living.  She declined.  We will plan for follow-up exam in 1 year with mammograms the day of her appointment.  Recommend Dermatologist evaluation for area left chest wall  Patient will be asked to return to the office in one year with a bilateral diagnotic mammogram, same day mammogram and office visit  HPI, Physical Exam, Assessment and Plan have been scribed under the direction and in the presence of Hervey Ard, MD.  Gaspar Cola, CMA HPI, assessment, plan and physical exam has been scribed under the direction and in the presence of Robert Bellow, MD. Karie Fetch, RN  I have completed the exam and reviewed the above documentation for accuracy and completeness.  I agree with the above.  Haematologist has been used and any errors in dictation or transcription are unintentional.  Hervey Ard, M.D., F.A.C.S.  Forest Gleason Nikitas Davtyan 01/05/2019, 1:13 PM

## 2019-02-18 ENCOUNTER — Encounter: Payer: Self-pay | Admitting: General Surgery

## 2019-11-23 ENCOUNTER — Other Ambulatory Visit: Payer: Self-pay | Admitting: General Surgery

## 2019-11-23 DIAGNOSIS — C50412 Malignant neoplasm of upper-outer quadrant of left female breast: Secondary | ICD-10-CM

## 2020-01-16 ENCOUNTER — Other Ambulatory Visit: Payer: BLUE CROSS/BLUE SHIELD

## 2020-01-30 ENCOUNTER — Ambulatory Visit
Admission: RE | Admit: 2020-01-30 | Discharge: 2020-01-30 | Disposition: A | Discharge status: Home / Self Care | Payer: BLUE CROSS/BLUE SHIELD | Source: Ambulatory Visit | Attending: General Surgery | Admitting: General Surgery

## 2020-01-30 DIAGNOSIS — C50412 Malignant neoplasm of upper-outer quadrant of left female breast: Secondary | ICD-10-CM

## 2020-12-06 ENCOUNTER — Other Ambulatory Visit: Payer: Self-pay | Admitting: General Surgery

## 2020-12-06 DIAGNOSIS — C50412 Malignant neoplasm of upper-outer quadrant of left female breast: Secondary | ICD-10-CM
